# Patient Record
Sex: Female | Born: 1965
Health system: Southern US, Community
[De-identification: ages and names within clinical notes are randomized; demographics above are authoritative.]

## PROBLEM LIST (undated history)

## (undated) DIAGNOSIS — R600 Localized edema: Secondary | ICD-10-CM

## (undated) DIAGNOSIS — F329 Major depressive disorder, single episode, unspecified: Secondary | ICD-10-CM

## (undated) DIAGNOSIS — E7800 Pure hypercholesterolemia, unspecified: Secondary | ICD-10-CM

## (undated) DIAGNOSIS — E119 Type 2 diabetes mellitus without complications: Secondary | ICD-10-CM

## (undated) DIAGNOSIS — R0602 Shortness of breath: Secondary | ICD-10-CM

## (undated) DIAGNOSIS — G4733 Obstructive sleep apnea (adult) (pediatric): Secondary | ICD-10-CM

## (undated) DIAGNOSIS — E669 Obesity, unspecified: Secondary | ICD-10-CM

## (undated) DIAGNOSIS — F419 Anxiety disorder, unspecified: Secondary | ICD-10-CM

## (undated) DIAGNOSIS — M549 Dorsalgia, unspecified: Secondary | ICD-10-CM

## (undated) DIAGNOSIS — H538 Other visual disturbances: Secondary | ICD-10-CM

## (undated) DIAGNOSIS — K5900 Constipation, unspecified: Secondary | ICD-10-CM

## (undated) DIAGNOSIS — F32A Depression, unspecified: Secondary | ICD-10-CM

## (undated) DIAGNOSIS — I10 Essential (primary) hypertension: Secondary | ICD-10-CM

## (undated) HISTORY — DX: Depression, unspecified: F32.A

## (undated) HISTORY — DX: Type 2 diabetes mellitus without complications: E11.9

## (undated) HISTORY — DX: Other visual disturbances: H53.8

## (undated) HISTORY — DX: Obesity, unspecified: E66.9

## (undated) HISTORY — DX: Constipation, unspecified: K59.00

## (undated) HISTORY — DX: Shortness of breath: R06.02

## (undated) HISTORY — DX: Localized edema: R60.0

## (undated) HISTORY — DX: Essential (primary) hypertension: I10

## (undated) HISTORY — PX: BREAST CYST EXCISION: SHX579

## (undated) HISTORY — DX: Anxiety disorder, unspecified: F41.9

## (undated) HISTORY — DX: Dorsalgia, unspecified: M54.9

## (undated) HISTORY — DX: Pure hypercholesterolemia, unspecified: E78.00

## (undated) HISTORY — DX: Obstructive sleep apnea (adult) (pediatric): G47.33

---
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
## 2001-10-14 ENCOUNTER — Ambulatory Visit (HOSPITAL_COMMUNITY): Admission: RE | Admit: 2001-10-14 | Discharge: 2001-10-14 | Payer: Self-pay | Admitting: Gastroenterology

## 2003-10-27 ENCOUNTER — Ambulatory Visit (HOSPITAL_COMMUNITY): Admission: RE | Admit: 2003-10-27 | Discharge: 2003-10-27 | Payer: Self-pay | Admitting: Family Medicine

## 2004-05-15 ENCOUNTER — Emergency Department (HOSPITAL_COMMUNITY): Admission: EM | Admit: 2004-05-15 | Discharge: 2004-05-15 | Payer: Self-pay | Admitting: Family Medicine

## 2004-12-13 ENCOUNTER — Other Ambulatory Visit: Admission: RE | Admit: 2004-12-13 | Discharge: 2004-12-13 | Payer: Self-pay | Admitting: Family Medicine

## 2005-12-09 ENCOUNTER — Encounter: Admission: RE | Admit: 2005-12-09 | Discharge: 2005-12-09 | Payer: Self-pay | Admitting: Family Medicine

## 2006-05-21 ENCOUNTER — Encounter: Admission: RE | Admit: 2006-05-21 | Discharge: 2006-05-21 | Payer: Self-pay | Admitting: Family Medicine

## 2006-09-26 ENCOUNTER — Encounter: Admission: RE | Admit: 2006-09-26 | Discharge: 2006-09-26 | Payer: Self-pay | Admitting: Family Medicine

## 2006-11-26 ENCOUNTER — Ambulatory Visit (HOSPITAL_COMMUNITY): Admission: RE | Admit: 2006-11-26 | Discharge: 2006-11-26 | Payer: Self-pay | Admitting: Gastroenterology

## 2006-12-11 ENCOUNTER — Encounter: Admission: RE | Admit: 2006-12-11 | Discharge: 2006-12-11 | Payer: Self-pay | Admitting: Family Medicine

## 2007-02-26 ENCOUNTER — Encounter (INDEPENDENT_AMBULATORY_CARE_PROVIDER_SITE_OTHER): Payer: Self-pay | Admitting: Diagnostic Radiology

## 2007-02-26 ENCOUNTER — Encounter: Admission: RE | Admit: 2007-02-26 | Discharge: 2007-02-26 | Payer: Self-pay | Admitting: Surgery

## 2007-02-26 HISTORY — PX: BREAST BIOPSY: SHX20

## 2007-12-22 ENCOUNTER — Encounter: Admission: RE | Admit: 2007-12-22 | Discharge: 2007-12-22 | Payer: Self-pay | Admitting: Family Medicine

## 2008-03-16 ENCOUNTER — Emergency Department (HOSPITAL_COMMUNITY): Admission: EM | Admit: 2008-03-16 | Discharge: 2008-03-16 | Payer: Self-pay | Admitting: Family Medicine

## 2008-05-03 ENCOUNTER — Emergency Department (HOSPITAL_COMMUNITY): Admission: EM | Admit: 2008-05-03 | Discharge: 2008-05-03 | Payer: Self-pay | Admitting: Emergency Medicine

## 2008-08-31 ENCOUNTER — Emergency Department (HOSPITAL_COMMUNITY): Admission: EM | Admit: 2008-08-31 | Discharge: 2008-08-31 | Payer: Self-pay | Admitting: Family Medicine

## 2008-11-21 ENCOUNTER — Encounter: Admission: RE | Admit: 2008-11-21 | Discharge: 2008-11-21 | Payer: Self-pay | Admitting: Family Medicine

## 2008-12-23 ENCOUNTER — Encounter: Admission: RE | Admit: 2008-12-23 | Discharge: 2008-12-23 | Payer: Self-pay | Admitting: Family Medicine

## 2009-12-11 ENCOUNTER — Emergency Department (HOSPITAL_COMMUNITY): Admission: EM | Admit: 2009-12-11 | Discharge: 2009-12-11 | Payer: Self-pay | Admitting: Family Medicine

## 2010-01-16 ENCOUNTER — Encounter: Admission: RE | Admit: 2010-01-16 | Discharge: 2010-01-16 | Payer: Self-pay | Admitting: Family Medicine

## 2010-12-03 ENCOUNTER — Other Ambulatory Visit: Payer: Self-pay | Admitting: Dermatology

## 2011-01-18 ENCOUNTER — Other Ambulatory Visit: Payer: Self-pay | Admitting: *Deleted

## 2011-01-18 DIAGNOSIS — Z1231 Encounter for screening mammogram for malignant neoplasm of breast: Secondary | ICD-10-CM

## 2011-01-31 ENCOUNTER — Ambulatory Visit
Admission: RE | Admit: 2011-01-31 | Discharge: 2011-01-31 | Disposition: A | Discharge status: Home / Self Care | Payer: 59 | Source: Ambulatory Visit | Attending: *Deleted | Admitting: *Deleted

## 2011-01-31 DIAGNOSIS — Z1231 Encounter for screening mammogram for malignant neoplasm of breast: Secondary | ICD-10-CM

## 2011-02-01 ENCOUNTER — Other Ambulatory Visit: Payer: Self-pay | Admitting: Family Medicine

## 2011-02-01 DIAGNOSIS — R928 Other abnormal and inconclusive findings on diagnostic imaging of breast: Secondary | ICD-10-CM

## 2011-02-01 NOTE — Op Note (Signed)
NAMEDENICE, CARDON               ACCOUNT NO.:  [id number]   MEDICAL RECORD NO.:  [id number]          PATIENT TYPE:  AMB   LOCATION:  ENDO                         FACILITY:  [facility]   PHYSICIAN:  Anselmo Rod, M.D.  DATE OF BIRTH:  [birth year]   DATE OF PROCEDURE:  11/26/2006  DATE OF DISCHARGE:  11/26/2006                               OPERATIVE REPORT   PROCEDURE PERFORMED:  Screening colonoscopy.   ENDOSCOPIST:  Anselmo Rod, M.D.   INSTRUMENT USED:  Pentax video colonoscope.   INDICATIONS FOR PROCEDURE:  A 45 year old white female with a family  history of colon cancer in her mother undergoing a screening  colonoscopy.  The patient has a history of occasional rectal bleeding.  Rule out colonic polyps, masses, etc.   PREPROCEDURE PREPARATION:  Informed consent was procured from the  patient.  The patient fasted for 4 hours prior to the procedure and  prepped with 20 Osmoprep pills the night of and 12 Osmoprep pills the  morning of the procedure.  Risks and benefits of the procedure including  a 10% miss rate of cancer and polyps were discussed with the patient as  well.   PREPROCEDURE PHYSICAL:  VITAL SIGNS:  Patient had stable vital signs.  NECK:  Supple.  CHEST:  Clear to auscultation.  CARDIAC:  S1 and S2 regular.  ABDOMEN:  Soft with normal bowel sounds.   DESCRIPTION OF PROCEDURE:  The patient was placed in the left lateral  decubitus position and sedated with 100 mcg of fentanyl and 10 mg of  Versed given intravenously in slow incremental doses.  Once the patient  was adequately sedated and maintained on low-flow oxygen and continuous  cardiac monitoring, the Pentax video colonoscope was advanced from the  rectum to the cecum.  There was some residual stool in the colon.  Multiple washes were done.  No masses, polyps, erosions, ulcerations or  diverticula were seen.  Small internal hemorrhoids were appreciated on  retroflexion in the rectum.  The appendiceal  orifice and ileocecal valve  were visualized after multiple washes in the right colon.  The terminal  ileum appeared healthy without lesions.  Retroflexion revealed small  internal hemorrhoids.  The rest of exam was unremarkable.   IMPRESSION:  1. Some residual stool in the right colon.  No masses, polyps,      erosions, ulcerations or diverticula seen.  2. Normal terminal ileum.  3. Small internal hemorrhoids seen on retroflexion.   RECOMMENDATIONS:  1. Continue high fiber diet with liberal fluid intake.  2. Repeat prep with GoLYTELY in 5 years or at an earlier date if the      patient has any abnormal symptoms in the      interim.  3. If the patient has any further rectal bleeding, Anusol      suppositories will be tried for the hemorrhoids and further      recommendations made in follow-up.      Anselmo Rod, M.D.  Electronically Signed     JNM/MEDQ  D:  11/26/2006  T:  11/28/2006  Job:  [id number]  cc:   Chales Salmon. Abigail Miyamoto, M.D.

## 2011-02-01 NOTE — Discharge Summary (Signed)
[place]  Patient:    Rhonda Garza, Rhonda Garza Visit Number: [id number] MRN: [id number]          Service Type: END Location: ENDO Attending Physician:  Charna Elizabeth Dictated by:   Anselmo Rod, M.D. Admit Date:  10/14/2001   CC:         Chales Salmon. Abigail Miyamoto, M.D.   Discharge Summary  DATE OF BIRTH:  [birth year]  REFERRING PHYSICIAN:  Chales Salmon. Abigail Miyamoto, M.D.  PROCEDURE PERFORMED:  Screening colonoscopy.  ENDOSCOPIST:  Anselmo Rod, M.D.  INSTRUMENT USED:  Pediatric adjustable colonoscope.  INDICATIONS FOR PROCEDURE:  Rectal bleeding in a 45 year old white female with a family history of colon cancer in her mother.  Rule out colonic polyps, masses, hemorrhoids, etc.  PREPROCEDURE PREPARATION:  Informed consent was procured from the patient. The patient was fasted for eight hours prior to the procedure and prepped with a bottle of magnesium citrate and a gallon of NuLytely the night prior to the procedure.  PREPROCEDURE PHYSICAL:  The patient had stable vital signs.  Neck supple. Chest clear to auscultation.  S1, S2 regular.  Abdomen soft with normal bowel sounds.  DESCRIPTION OF PROCEDURE:  The patient was placed in the left lateral decubitus position and sedated with 60 mg of Demerol and 6 mg of Versed intravenously.  Once the patient was adequately sedated and maintained on low-flow oxygen and continuous cardiac monitoring, the Olympus video colonoscope was advanced from the rectum to the cecum without difficulty. Except for a small external hemorrhoid and small internal hemorrhoids, no other abnormalities were noted.  The procedure was complete up to the cecum. The ileocecal valve and appendiceal orifice were clearly visualized and photographed.  There was no evidence of diverticulosis.  IMPRESSION: 1. Small external hemorrhoid and small nonbleeding internal hemorrhoids. 2. Otherwise normal colonoscopy up to the  cecum.  RECOMMENDATIONS: 1. A high fiber diet has been recommended for the patient. 2. If the patients rectal bleeding continues, a surgical evaluation may be    considered. 3. Repeat colorectal cancer screening is recommended in the next five years    unless the patient were to develop any abnormal symptoms in the interim. 4. Outpatient follow-up is advised on a p.r.n. basis.Dictated by:   Dorita Sciara, M.D. Attending Physician:  Charna Elizabeth DD:  10/14/01 TD:  10/14/01 Job: [id number]

## 2011-02-06 ENCOUNTER — Ambulatory Visit
Admission: RE | Admit: 2011-02-06 | Discharge: 2011-02-06 | Disposition: A | Discharge status: Home / Self Care | Payer: 59 | Source: Ambulatory Visit | Attending: Family Medicine | Admitting: Family Medicine

## 2011-02-06 ENCOUNTER — Other Ambulatory Visit: Payer: 59

## 2011-02-06 DIAGNOSIS — R928 Other abnormal and inconclusive findings on diagnostic imaging of breast: Secondary | ICD-10-CM

## 2011-07-30 DIAGNOSIS — F411 Generalized anxiety disorder: Secondary | ICD-10-CM | POA: Insufficient documentation

## 2012-02-14 ENCOUNTER — Other Ambulatory Visit: Payer: Self-pay | Admitting: Family Medicine

## 2012-02-14 DIAGNOSIS — Z1231 Encounter for screening mammogram for malignant neoplasm of breast: Secondary | ICD-10-CM

## 2012-02-27 ENCOUNTER — Ambulatory Visit
Admission: RE | Admit: 2012-02-27 | Discharge: 2012-02-27 | Disposition: A | Discharge status: Home / Self Care | Payer: 59 | Source: Ambulatory Visit | Attending: Family Medicine | Admitting: Family Medicine

## 2012-02-27 DIAGNOSIS — Z1231 Encounter for screening mammogram for malignant neoplasm of breast: Secondary | ICD-10-CM

## 2012-03-03 ENCOUNTER — Ambulatory Visit: Payer: 59

## 2013-05-24 ENCOUNTER — Other Ambulatory Visit: Payer: Self-pay

## 2013-05-24 ENCOUNTER — Ambulatory Visit
Admission: RE | Admit: 2013-05-24 | Discharge: 2013-05-24 | Disposition: A | Discharge status: Home / Self Care | Payer: 59 | Source: Ambulatory Visit

## 2013-05-24 DIAGNOSIS — Z1231 Encounter for screening mammogram for malignant neoplasm of breast: Secondary | ICD-10-CM

## 2014-01-21 ENCOUNTER — Other Ambulatory Visit: Payer: Self-pay | Admitting: Dermatology

## 2014-03-08 ENCOUNTER — Other Ambulatory Visit: Payer: Self-pay | Admitting: Dermatology

## 2014-03-31 DIAGNOSIS — Z808 Family history of malignant neoplasm of other organs or systems: Secondary | ICD-10-CM | POA: Insufficient documentation

## 2014-05-30 ENCOUNTER — Other Ambulatory Visit: Payer: Self-pay

## 2014-05-30 DIAGNOSIS — Z1231 Encounter for screening mammogram for malignant neoplasm of breast: Secondary | ICD-10-CM

## 2014-06-23 ENCOUNTER — Ambulatory Visit
Admission: RE | Admit: 2014-06-23 | Discharge: 2014-06-23 | Disposition: A | Discharge status: Home / Self Care | Payer: 59 | Source: Ambulatory Visit

## 2014-06-23 DIAGNOSIS — Z1231 Encounter for screening mammogram for malignant neoplasm of breast: Secondary | ICD-10-CM

## 2015-08-07 ENCOUNTER — Other Ambulatory Visit: Payer: Self-pay

## 2015-08-07 DIAGNOSIS — Z1231 Encounter for screening mammogram for malignant neoplasm of breast: Secondary | ICD-10-CM

## 2015-08-08 ENCOUNTER — Ambulatory Visit
Admission: RE | Admit: 2015-08-08 | Discharge: 2015-08-08 | Disposition: A | Discharge status: Home / Self Care | Payer: 59 | Source: Ambulatory Visit

## 2015-08-08 DIAGNOSIS — Z1231 Encounter for screening mammogram for malignant neoplasm of breast: Secondary | ICD-10-CM

## 2015-11-15 DIAGNOSIS — G4733 Obstructive sleep apnea (adult) (pediatric): Secondary | ICD-10-CM

## 2015-11-15 HISTORY — DX: Obstructive sleep apnea (adult) (pediatric): G47.33

## 2015-12-01 MED FILL — SIMVASTATIN 40 MG TABLET: 40 | 30 days supply | Qty: 30 | Fill #0

## 2015-12-01 MED FILL — VALSARTAN-HCTZ 160-12.5 MG: 160-12.5 | 30 days supply | Qty: 30 | Fill #0

## 2015-12-01 MED FILL — ALPRAZolam 0.25 MG TABS: 0.25 | 30 days supply | Qty: 30 | Fill #0

## 2015-12-01 MED FILL — ESCITALOPRAM 10 MG TABLET: 10 | 90 days supply | Qty: 90 | Fill #0 | Status: TO

## 2016-01-16 DIAGNOSIS — I10 Essential (primary) hypertension: Secondary | ICD-10-CM | POA: Diagnosis not present

## 2016-01-16 DIAGNOSIS — F5081 Binge eating disorder: Secondary | ICD-10-CM | POA: Diagnosis not present

## 2016-01-16 DIAGNOSIS — Z0001 Encounter for general adult medical examination with abnormal findings: Secondary | ICD-10-CM | POA: Diagnosis not present

## 2016-01-16 MED FILL — VYVANSE 50 MG CAPSULE: 50 | 30 days supply | Qty: 30 | Fill #0

## 2016-01-16 MED FILL — SIMVASTATIN 40 MG TABLET: 40 | 60 days supply | Qty: 60 | Fill #0

## 2016-01-16 MED FILL — VALSARTAN-HCTZ 160-12.5 MG: 160-12.5 | 90 days supply | Qty: 90 | Fill #0

## 2016-01-17 DIAGNOSIS — I10 Essential (primary) hypertension: Secondary | ICD-10-CM | POA: Diagnosis not present

## 2016-01-17 DIAGNOSIS — Z0000 Encounter for general adult medical examination without abnormal findings: Secondary | ICD-10-CM | POA: Diagnosis not present

## 2016-01-17 DIAGNOSIS — E785 Hyperlipidemia, unspecified: Secondary | ICD-10-CM | POA: Diagnosis not present

## 2016-03-08 MED FILL — ESCITALOPRAM 10 MG TABLET: 10 | 90 days supply | Qty: 90 | Fill #0

## 2016-03-08 MED FILL — SIMVASTATIN 40 MG TABLET: 40 | 30 days supply | Qty: 30 | Fill #0

## 2016-03-08 MED FILL — ALPRAZolam 0.25 MG TABS: 0.25 | 30 days supply | Qty: 30 | Fill #0

## 2016-04-08 DIAGNOSIS — F411 Generalized anxiety disorder: Secondary | ICD-10-CM | POA: Diagnosis not present

## 2016-04-08 DIAGNOSIS — I10 Essential (primary) hypertension: Secondary | ICD-10-CM | POA: Diagnosis not present

## 2016-04-15 MED FILL — SIMVASTATIN 40 MG TABLET: 40 | 90 days supply | Qty: 90 | Fill #1

## 2016-04-15 MED FILL — VALSARTAN-HCTZ 160-12.5 MG: 160-12.5 | 90 days supply | Qty: 90 | Fill #1

## 2016-06-18 DIAGNOSIS — L821 Other seborrheic keratosis: Secondary | ICD-10-CM | POA: Diagnosis not present

## 2016-06-18 DIAGNOSIS — D224 Melanocytic nevi of scalp and neck: Secondary | ICD-10-CM | POA: Diagnosis not present

## 2016-06-18 DIAGNOSIS — B078 Other viral warts: Secondary | ICD-10-CM | POA: Diagnosis not present

## 2016-06-18 DIAGNOSIS — D2271 Melanocytic nevi of right lower limb, including hip: Secondary | ICD-10-CM | POA: Diagnosis not present

## 2016-06-18 DIAGNOSIS — D2261 Melanocytic nevi of right upper limb, including shoulder: Secondary | ICD-10-CM | POA: Diagnosis not present

## 2016-06-18 DIAGNOSIS — D2262 Melanocytic nevi of left upper limb, including shoulder: Secondary | ICD-10-CM | POA: Diagnosis not present

## 2016-06-18 DIAGNOSIS — D225 Melanocytic nevi of trunk: Secondary | ICD-10-CM | POA: Diagnosis not present

## 2016-06-18 DIAGNOSIS — D485 Neoplasm of uncertain behavior of skin: Secondary | ICD-10-CM | POA: Diagnosis not present

## 2016-06-18 DIAGNOSIS — D2272 Melanocytic nevi of left lower limb, including hip: Secondary | ICD-10-CM | POA: Diagnosis not present

## 2016-06-18 DIAGNOSIS — D2371 Other benign neoplasm of skin of right lower limb, including hip: Secondary | ICD-10-CM | POA: Diagnosis not present

## 2016-07-09 MED FILL — VALSARTAN-HCTZ 160-12.5 MG: 160-12.5 | 90 days supply | Qty: 90 | Fill #0

## 2016-07-09 MED FILL — ESCITALOPRAM 10 MG TABLET: 10 | 90 days supply | Qty: 90 | Fill #0

## 2016-07-09 MED FILL — SIMVASTATIN 40 MG TABLET: 40 | 60 days supply | Qty: 60 | Fill #2

## 2016-08-06 MED FILL — ALPRAZolam 0.25 MG TABS: 0.25 | 30 days supply | Qty: 30 | Fill #0

## 2016-09-06 DIAGNOSIS — I10 Essential (primary) hypertension: Secondary | ICD-10-CM | POA: Diagnosis not present

## 2016-09-06 DIAGNOSIS — Z6841 Body Mass Index (BMI) 40.0 and over, adult: Secondary | ICD-10-CM | POA: Diagnosis not present

## 2016-09-06 DIAGNOSIS — Z79899 Other long term (current) drug therapy: Secondary | ICD-10-CM | POA: Diagnosis not present

## 2016-09-06 DIAGNOSIS — E785 Hyperlipidemia, unspecified: Secondary | ICD-10-CM | POA: Diagnosis not present

## 2016-09-06 DIAGNOSIS — E119 Type 2 diabetes mellitus without complications: Secondary | ICD-10-CM | POA: Insufficient documentation

## 2016-09-06 DIAGNOSIS — F411 Generalized anxiety disorder: Secondary | ICD-10-CM | POA: Diagnosis not present

## 2016-09-06 MED FILL — SIMVASTATIN 40 MG TABLET: 40 | 90 days supply | Qty: 90 | Fill #0

## 2016-09-06 MED FILL — ALPRAZolam 0.25 MG TABS: 0.25 | 30 days supply | Qty: 30 | Fill #0

## 2016-09-13 MED FILL — VALSARTAN-HCTZ 160-25 MG TA: 160-25 | 90 days supply | Qty: 90 | Fill #0

## 2016-09-13 MED FILL — FLUTICASONE PROP 50 MCG SPR: 50 | 60 days supply | Qty: 16 | Fill #0

## 2016-09-20 MED FILL — metFORMIN HCL 500 MG TABS: 500 | 30 days supply | Qty: 60 | Fill #0

## 2016-09-26 DIAGNOSIS — J0110 Acute frontal sinusitis, unspecified: Secondary | ICD-10-CM | POA: Diagnosis not present

## 2016-09-26 DIAGNOSIS — I10 Essential (primary) hypertension: Secondary | ICD-10-CM | POA: Diagnosis not present

## 2016-09-26 DIAGNOSIS — Z79899 Other long term (current) drug therapy: Secondary | ICD-10-CM | POA: Diagnosis not present

## 2016-09-26 DIAGNOSIS — R0683 Snoring: Secondary | ICD-10-CM | POA: Diagnosis not present

## 2016-09-26 DIAGNOSIS — E1165 Type 2 diabetes mellitus with hyperglycemia: Secondary | ICD-10-CM | POA: Diagnosis not present

## 2016-09-26 DIAGNOSIS — E119 Type 2 diabetes mellitus without complications: Secondary | ICD-10-CM | POA: Diagnosis not present

## 2016-09-26 MED FILL — AMOX-CLAV 875-125 MG TABLET: 875-125 | 14 days supply | Qty: 28 | Fill #0

## 2016-09-27 ENCOUNTER — Other Ambulatory Visit: Payer: Self-pay | Admitting: Family Medicine

## 2016-09-27 DIAGNOSIS — Z1231 Encounter for screening mammogram for malignant neoplasm of breast: Secondary | ICD-10-CM

## 2016-10-11 MED FILL — metFORMIN HCL 500 MG TABS: 500 | 30 days supply | Qty: 120 | Fill #0

## 2016-10-22 ENCOUNTER — Ambulatory Visit
Admission: RE | Admit: 2016-10-22 | Discharge: 2016-10-22 | Disposition: A | Discharge status: Home / Self Care | Payer: 59 | Source: Ambulatory Visit | Attending: Family Medicine | Admitting: Family Medicine

## 2016-10-22 DIAGNOSIS — Z1231 Encounter for screening mammogram for malignant neoplasm of breast: Secondary | ICD-10-CM | POA: Diagnosis not present

## 2016-10-23 ENCOUNTER — Other Ambulatory Visit: Payer: Self-pay | Admitting: Family Medicine

## 2016-10-23 ENCOUNTER — Other Ambulatory Visit: Payer: Self-pay

## 2016-10-23 DIAGNOSIS — N63 Unspecified lump in unspecified breast: Secondary | ICD-10-CM

## 2016-10-23 DIAGNOSIS — R928 Other abnormal and inconclusive findings on diagnostic imaging of breast: Secondary | ICD-10-CM

## 2016-10-29 ENCOUNTER — Ambulatory Visit
Admission: RE | Admit: 2016-10-29 | Discharge: 2016-10-29 | Disposition: A | Discharge status: Home / Self Care | Payer: 59 | Source: Ambulatory Visit | Attending: Family Medicine | Admitting: Family Medicine

## 2016-10-29 DIAGNOSIS — R928 Other abnormal and inconclusive findings on diagnostic imaging of breast: Secondary | ICD-10-CM

## 2016-10-29 DIAGNOSIS — N6311 Unspecified lump in the right breast, upper outer quadrant: Secondary | ICD-10-CM | POA: Diagnosis not present

## 2016-11-04 ENCOUNTER — Encounter: Payer: Self-pay | Admitting: Neurology

## 2016-11-04 ENCOUNTER — Ambulatory Visit (INDEPENDENT_AMBULATORY_CARE_PROVIDER_SITE_OTHER): Payer: 59 | Admitting: Neurology

## 2016-11-04 VITALS — BP 124/76 | HR 78 | Resp 16 | Ht 64.0 in | Wt 272.0 lb

## 2016-11-04 DIAGNOSIS — I10 Essential (primary) hypertension: Secondary | ICD-10-CM | POA: Diagnosis not present

## 2016-11-04 DIAGNOSIS — E669 Obesity, unspecified: Secondary | ICD-10-CM

## 2016-11-04 DIAGNOSIS — E1100 Type 2 diabetes mellitus with hyperosmolarity without nonketotic hyperglycemic-hyperosmolar coma (NKHHC): Secondary | ICD-10-CM

## 2016-11-04 DIAGNOSIS — R0683 Snoring: Secondary | ICD-10-CM

## 2016-11-04 DIAGNOSIS — G4719 Other hypersomnia: Secondary | ICD-10-CM

## 2016-11-04 NOTE — Patient Instructions (Signed)

## 2016-11-04 NOTE — Progress Notes (Signed)
SLEEP MEDICINE CLINIC   Provider:  Larey Seat, M D  Referring Provider: Jeri Cos, MD Primary Care Physician:  Jeri Cos, MD in [facility]  Chief Complaint  Patient presents with  . Sleep Consult    Rm 10. Snores, witnessed apnea, wakes up feeling tired, morning headaches, daytime fatigue, takes naps.     HPI:  Rhonda Garza is a 51 y.o. female , seen here as a referral from Dr. Hector Shade for a sleep evaluation,  Chief complaint according to patient : "I am obese, and snore and feel always tired"   I had the pleasure of seeing Rhonda Garza is a new patient today. Rhonda Garza is concerned about having sleep apnea, she suspects that she may have sleep disordered breathing. BuSpar she has never been formally evaluated. But her comorbidities of morbid obesity, hypertension, diabetes mellitus, as well as anxiety and non-restorative sleep have made her think that she may have sleep apnea as well. She further endorsed restless legs, the feeling of being hot at night which may be perimenopausal, and blurred vision in the morning could all indicate or attributed to obstructive sleep apnea. Her diabetic condition was diagnosed in December  2017.  She has lost 13 pounds over the last 2 month after she begun taking metformin. She has bought a fit bit in October, and this begun her new health awareness.  Her husband has witnessed her to snore every night and every morning she feels tired.  Sleep habits are as follows: She works usually in front of a computer screen and on her smart form until bedtime. Bedtime is usually between 11 PM and midnight, she will go to the bedroom at that time and does not watch TV or use any back lit screens in the bedroom. He prefers up from sleep position but also sleeps on her side. She will choke or feel short of air when sleeping supine., She sleeps on multiple pillows, 3-4.  Her bedroom is dark, the usual bedroom temperature is around 68  Fahrenheit, and quiet. She shares a bed with her husband. She does not report interrupted sleep, she does not have the urge to urinate at night. She rises in the morning with an alarm at 6:30- 7.30 and wishes to sleep another hour.  Over the last month or years she has developed a dry mouth in the morning, and frequently headaches. The headaches lift around lunch time. She drinks caffeine in AM, Soda , and commutes one hour each way to work at [employer], and she drives feeling tired.  She has a window at her desk. She keeps a regular lunch break, but keeps at her desk.     Sleep medical history and family sleep history: mother had OSA, died in 01-08-2003.   Social history: married with 2 children, a son 46 and a daughter, 47. Former smoker, quit in her first pregnancy. Rare ETOH.  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  , "desk job ". Walking for exercise. 5 times a week.   Review of Systems: Out of a complete 14 system review, the patient complains of only the following symptoms, and all other reviewed systems are negative.  Epworth score 11 , Fatigue severity score 37  , depression score 2/15    Social History   Social History  . Marital status: Married    Spouse name: Rhonda Garza  . Number of children: 2  . Years of education: college   Occupational History  . Not on file.  Social History Main Topics  . Smoking status: Former Research scientist (life sciences)  . Smokeless tobacco: Never Used     Comment: Quit 1999  . Alcohol use Yes     Comment: Rare  . Drug use: No  . Sexual activity: Not on file   Other Topics Concern  . Not on file   Social History Narrative   Drinks caffeine, trying to stop currently     Family History  Problem Relation Age of Onset  . Cancer Mother   . Cancer Father     Past Medical History:  Diagnosis Date  . Anxiety   . Diabetes mellitus without complication ([place])   . High cholesterol   . Hypertension     Past Surgical History:  Procedure Laterality Date  . BREAST BIOPSY  Right 02/26/2007  . BREAST EXCISIONAL BIOPSY Right 1985  . CESAREAN SECTION      Current Outpatient Prescriptions  Medication Sig Dispense Refill  . ALPRAZolam (XANAX) 0.25 MG tablet Take by mouth.    . escitalopram (LEXAPRO) 10 MG tablet TAKE 1 TABLET BY MOUTH DAILY    . metFORMIN (GLUCOPHAGE) 500 MG tablet   2  . simvastatin (ZOCOR) 40 MG tablet TAKE 1 TABLET BY MOUTH AT BEDTIME.    . valsartan-hydrochlorothiazide (DIOVAN-HCT) 160-25 MG tablet Take by mouth.     No current facility-administered medications for this visit.     Allergies as of 11/04/2016  . (No Known Allergies)    Vitals: BP 124/76   Pulse 78   Resp 16   Ht 5\' 4"  (1.626 m)   Wt 272 lb (123.4 kg)   BMI 46.69 kg/m  Last Weight:  Wt Readings from Last 1 Encounters:  11/04/16 272 lb (123.4 kg)   [id number] mass index is 46.69 kg/m.     Last Height:   Ht Readings from Last 1 Encounters:  11/04/16 5\' 4"  (1.626 m)    Physical exam:  General: The patient is awake, alert and appears not in acute distress. The patient is well groomed. Head: Normocephalic, atraumatic. Neck is supple. Mallampati 5- ,  neck circumference:18.5. Nasal airflow patent , Retrognathia is not seen.  Cardiovascular:  Regular rate and rhythm , without  murmurs or carotid bruit, and without distended neck veins. Respiratory: Lungs are clear to auscultation. Skin:  Without evidence of edema, or rash Trunk: BMI is 47 . The patient's posture is erect   Neurologic exam : The patient is awake and alert, oriented to place and time.   Attention span & concentration ability appears limited, distractible Speech is fluent,  without dysarthria, dysphonia or aphasia.  Mood and affect are appropriate.  Cranial nerves: Pupils are equal and briskly reactive to light.  Funduscopic exam without  evidence of pallor or edema. Extraocular movements  in vertical and horizontal planes intact and without nystagmus. Visual fields by finger perimetry are  intact. Hearing to finger rub intact. Facial sensation intact to fine touch. Facial motor strength is symmetric and tongue moved  Midline. Uvula cannot be seen -  Shoulder shrug was symmetrical.   Motor exam:   Normal tone, muscle bulk and symmetric strength in all extremities. Sensory:  Fine touch, pinprick and vibration were tested in all extremities. Proprioception tested in the upper extremities was normal. Coordination: Rapid alternating movements in the fingers/hands was normal. Finger-to-nose maneuver  normal without evidence of ataxia, dysmetria or tremor.  Gait and station: Patient walks without assistive device and is able unassisted to climb up to the exam  table. Strength within normal limits.  Stance is stable and normal.   Deep tendon reflexes: in the  upper and lower extremities are symmetric and intact. Babinski maneuver response is downgoing.  The patient was advised of the nature of the diagnosed sleep disorder , the treatment options and risks for general a health and wellness arising from not treating the condition.  I spent more than 45  minutes of face to face time with the patient. Greater than 50% of time was spent in counseling and coordination of care. We have discussed the diagnosis and differential and I answered the patient's questions.     Assessment:  After physical and neurologic examination, review of laboratory studies,  Personal review of imaging studies, reports of other /same  Imaging studies ,  Results of polysomnography/ neurophysiology testing and pre-existing records as far as provided in visit., my assessment is   1)   Mrs. Blankley has multiple risk factors for obstructive sleep apnea, mainly the body mass index, her neck circumference and her high grade narrowed upper airway. Her husband has witnessed her to snore and she has woken from sleep feeling choked short of breath especially when in supine position. I have no doubt that we will diagnose her with  obstructive sleep apnea. Her fit bit also has recorded her as being rather restless tossing and turning and her sleep quality is perceived as before. She has more daytime sleepiness, and fatigue. In addition she was just diagnosed with diabetes mellitus, started on metformin and has begun losing weight. She is motivated to continue her weight loss quest and hopefully reverse the diabetic condition. Treatment of obstructive sleep apnea can be essential in achieving this goal. It is a the deepest sleep before midnight that allows her to cleave more insulin and to lower her overnight blood sugars.  Plan:  Treatment plan and additional workup :  SPLIT at [place], MD  RV with me   Larey Seat MD  11/04/2016   CC: Jeri Cos, Md [place], [place] [id number]

## 2016-11-13 MED FILL — metFORMIN HCL 500 MG TABS: 500 | 30 days supply | Qty: 120 | Fill #1

## 2016-11-18 ENCOUNTER — Ambulatory Visit (INDEPENDENT_AMBULATORY_CARE_PROVIDER_SITE_OTHER): Payer: 59 | Admitting: Neurology

## 2016-11-18 DIAGNOSIS — E1100 Type 2 diabetes mellitus with hyperosmolarity without nonketotic hyperglycemic-hyperosmolar coma (NKHHC): Secondary | ICD-10-CM

## 2016-11-18 DIAGNOSIS — G4710 Hypersomnia, unspecified: Secondary | ICD-10-CM

## 2016-11-18 DIAGNOSIS — G4719 Other hypersomnia: Secondary | ICD-10-CM

## 2016-11-18 DIAGNOSIS — E669 Obesity, unspecified: Secondary | ICD-10-CM

## 2016-11-18 DIAGNOSIS — R0683 Snoring: Secondary | ICD-10-CM

## 2016-11-18 DIAGNOSIS — I10 Essential (primary) hypertension: Secondary | ICD-10-CM

## 2016-11-21 ENCOUNTER — Telehealth: Payer: Self-pay

## 2016-11-21 NOTE — Procedures (Signed)
PATIENT'S NAME:  Rhonda Garza, Rhonda Garza DOB:      02/25/1966      MR#:    [id number]     DATE OF RECORDING: 11/18/2016 REFERRING M.D.:  Jeri Cos, MD in [facility] Performed:   Baseline Polysomnogram HISTORY:  Mrs. Jungwirth is concerned about sleep apnea, she suspects that she may have sleep disordered breathing. Important comorbidities are super/ morbid obesity ( BMI of 46.3) , hypertension, diabetes mellitus, anxiety disorder and  restless legs, the feeling of being hot at night, anxiety and diaphoresis, and sometimes headaches are present in the morning, These all could indicate or attribute to obstructive sleep apnea.  Her diabetic condition was diagnosed in December 2017.  She has lost 13 pounds over the last 2 month after she begun taking metformin. She has bought a fit bit in October, and begun gaining more health awareness. Her husband has witnessed her to snore every night and every morning she feels tired.   The patient endorsed the Epworth Sleepiness Scale at 11/24 points.   The patient's weight 272 pounds with a height of 64 (inches), resulting in a BMI of 46.3 kg/m2. The patient's neck circumference measured 18.5 inches.  CURRENT MEDICATIONS: Alprazolam, Escitalopram, Metformin, Simvastatin and Valsartan-Hydrochlorothiazide   PROCEDURE:  This is a multichannel digital polysomnogram utilizing the Somnostar 11.2 system.  Electrodes and sensors were applied and monitored per AASM Specifications.   EEG, EOG, Chin and Limb EMG, were sampled at 200 Hz.  ECG, Snore and Nasal Pressure, Thermal Airflow, Respiratory Effort, CPAP Flow and Pressure, Oximetry was sampled at 50 Hz. Digital video and audio were recorded.      BASELINE STUDY Lights Out was at 22:40 and Lights On at 05:00.  Total recording time (TRT) was 380.5 minutes, with a total sleep time (TST) of 315 minutes.   The patient's sleep latency was 47.5 minutes.  REM latency was 231 minutes.  The sleep efficiency was 82.8 %.      SLEEP ARCHITECTURE: WASO (Wake after sleep onset) was 18 minutes.  There were 14.5 minutes in Stage N1, 265 minutes Stage N2, 0 minutes Stage N3 and 35.5 minutes in Stage REM.  The percentage of Stage N1 was 4.6%, Stage N2 was 84.1%, Stage N3 was 0% and Stage R (REM sleep) was 11.3%.    RESPIRATORY ANALYSIS:  There were a total of 22 respiratory events:  0 obstructive apneas, 0 central apneas and 0 mixed apneas with a total of 0 apneas and an apnea index (AI) of 0 /hour. There were 22 hypopneas with a hypopnea index of 4.2 /hour. The patient also had 0 respiratory event related arousals (RERAs).    The total APNEA/HYPOPNEA INDEX (AHI) was 4.2/hour and the total RESPIRATORY DISTURBANCE INDEX was 4.2 /hour.  7 events occurred in REM sleep and 30 events in NREM. The REM AHI was 11.8 /hour, versus a non-REM AHI of 3.2. The patient spent 35 minutes of total sleep time in the supine position and 280 minutes in non-supine. The supine AHI was 13.7 versus a non-supine AHI of 3.0.  OXYGEN SATURATION & C02:  The Wake baseline 02 saturation was 96%, with the lowest being 84%. Time spent below 89% saturation equaled 7 minutes.  PERIODIC LIMB MOVEMENTS:  The patient had a total of 0 Periodic Limb Movements.   The arousals were noted as: 22 were spontaneous, 0 were associated with PLMs, and 22 were associated with respiratory events. Audio and video analysis did not show any abnormal or unusual  movements, behaviors, phonations or vocalizations.  The patient did not take bathroom breaks. Snoring was noted and worse in supine. EKG was in keeping with normal sinus rhythm (NSR).   IMPRESSION:  1. Very mild Obstructive Sleep Apnea(OSA) with REM sleep and positional dependence. 2. Primary Snoring with supine sleep accentuation. 3.  No hypoxemia of clinical significance, no explanation for nocturnal hot flushes or headache .   RECOMMENDATIONS:  1. Positional therapy is advised. Avoiding supine sleep by using a  tennis ball.  2. Avoid sedative-hypnotics which may worsen sleep apnea, alcohol and tobacco (as applicable). 3. Advise to lose weight, diet and exercise if not contraindicated (BMI 46.3 ). 4. A dental device may be indicated if primary snoring is of clinical concern. 5. No neurological follow up needed. A follow up appointment will be scheduled with the Sleep Clinic at [facility] to discuss results and make referral arrangements as suggested above.  The referring provider will be notified of the results.      I certify that I have reviewed the entire raw data recording prior to the issuance of this report in accordance with the Standards of Accreditation of the American Academy of Sleep Medicine (AASM)      Larey Seat, MD  11-21-2016 Diplomat, American Board of Psychiatry and Neurology  Diplomat, American Board of [place] Director, [place] [facility]

## 2016-11-21 NOTE — Telephone Encounter (Signed)
-----   Message from Larey Seat, MD sent at 11/21/2016 12:14 PM EST ----- IMPRESSION:  1. Very mild Obstructive Sleep Apnea(OSA) with REM sleep and positional dependence. 2. Primary Snoring with supine sleep accentuation. 3.  No hypoxemia of clinical significance, no explanation for nocturnal hot flushes or headache .   RECOMMENDATIONS:  1. Positional therapy is advised. Avoiding supine sleep by using a tennis ball.  2. Avoid sedative-hypnotics which may worsen sleep apnea, alcohol and tobacco (as applicable). 3. Advise to lose weight, diet and exercise if not contraindicated (BMI 46.3). We can assist in referring to a bariatric medicine clinic.  4. A dental device may be indicated if primary snoring is of clinical concern. 5. No neurological follow up needed. A follow up appointment will be scheduled with the Sleep Clinic at [facility] to discuss results and make referral arrangements as suggested above.  The referring provider will be notified of the results.      I certify that I have reviewed the entire raw data recording prior to the issuance of this report in accordance with the Standards of Accreditation of the American Academy of Sleep Medicine (AASM)      Larey Seat, MD  11-21-2016 Diplomat, American Board of Psychiatry and Neurology  Diplomat, American Board of [place] Director, [place] [facility]

## 2016-11-21 NOTE — Telephone Encounter (Signed)
I called pt to discuss her sleep study results. No answer, left a message asking her to call me back. 

## 2016-11-21 NOTE — Telephone Encounter (Signed)
I called pt. I advised her of her sleep study results. Pt does not want the referrals suggested at this time and also declined a follow up appt. Pt verbalized understanding of results. Pt had no questions at this time but was encouraged to call back if questions arise.

## 2016-12-02 MED FILL — ESCITALOPRAM 10 MG TABLET: 10 | 90 days supply | Qty: 90 | Fill #0

## 2016-12-02 MED FILL — SIMVASTATIN 40 MG TABLET: 40 | 90 days supply | Qty: 90 | Fill #1

## 2016-12-04 DIAGNOSIS — E119 Type 2 diabetes mellitus without complications: Secondary | ICD-10-CM | POA: Diagnosis not present

## 2016-12-05 DIAGNOSIS — E785 Hyperlipidemia, unspecified: Secondary | ICD-10-CM | POA: Diagnosis not present

## 2016-12-05 DIAGNOSIS — R0683 Snoring: Secondary | ICD-10-CM | POA: Diagnosis not present

## 2016-12-05 DIAGNOSIS — I10 Essential (primary) hypertension: Secondary | ICD-10-CM | POA: Diagnosis not present

## 2016-12-05 DIAGNOSIS — E119 Type 2 diabetes mellitus without complications: Secondary | ICD-10-CM | POA: Diagnosis not present

## 2016-12-05 MED FILL — metFORMIN HCL 1000 MG TABS: 1000 | 90 days supply | Qty: 180 | Fill #0

## 2016-12-05 MED FILL — VICTOZA 2-PAK 18 MG/3 ML PE: 18 | 30 days supply | Qty: 9 | Fill #0

## 2016-12-11 ENCOUNTER — Other Ambulatory Visit: Payer: Self-pay | Admitting: *Deleted

## 2016-12-11 ENCOUNTER — Encounter: Payer: Self-pay | Admitting: *Deleted

## 2016-12-11 VITALS — Ht 64.0 in | Wt 268.0 lb

## 2016-12-11 DIAGNOSIS — E785 Hyperlipidemia, unspecified: Secondary | ICD-10-CM | POA: Insufficient documentation

## 2016-12-11 DIAGNOSIS — E119 Type 2 diabetes mellitus without complications: Secondary | ICD-10-CM

## 2016-12-11 DIAGNOSIS — E669 Obesity, unspecified: Secondary | ICD-10-CM | POA: Insufficient documentation

## 2016-12-11 DIAGNOSIS — I10 Essential (primary) hypertension: Secondary | ICD-10-CM

## 2016-12-11 LAB — POCT CBG (FASTING - GLUCOSE)-MANUAL ENTRY: GLUCOSE FASTING, POC: 121 mg/dL — AB (ref 70–99)

## 2016-12-11 NOTE — Patient Outreach (Signed)
[place] [facility]) Care Management   12/11/2016  Rhonda Garza 1966/09/16 [id number]  Rhonda Garza is an 51 y.o. female [place] Management office to enroll in the Link To Wellness program for self management assistance with Type II DM, HTN, hyperlipidemia and morbid obesity.  Subjective: Rhonda Garza says she was referred to the Link To Wellness program by coworkers after she was diagnosed with type II DM in the last year. She reports her most recent Hgb A1C as 7.1%, improved from 8.1%. She says she has not attended formal diabetes education classes and has not been checking her blood sugar because she knew she would receive a glucometer at no cost through the program.  She says she also has high blood pressure and cholesterol issues and that as far she knows the medications she is taking is controlling those conditions. She says she has a home BP monitor but does not self monitor because the cuff no longer fits her upper arm. She says she always attend the on site health screenings at her work place to have her blood pressure and blood sugar checked.  She says she would also like to lose weight as she has gained a  significant amount of weight in the last few years but has been overweight most of her adult life. Rhonda Garza says she has lost 16 lbs since December when she was told she had diabetes and says she started Victoza three  days ago and hopes this will also help her with ongoing weight loss.   Objective:   Review of Systems  Constitutional: Negative.     Physical Exam  Constitutional: She is oriented to person, place, and time. She appears well-developed and well-nourished.  Respiratory: Effort normal.  Neurological: She is alert and oriented to person, place, and time.  Skin: Skin is warm and dry.  Psychiatric: She has a normal mood and affect. Her behavior is normal. Judgment and thought content normal.   Weight= 268.0 lbs (BMI= 49.0) Random POC  CBG= 121  Encounter Medications:   Outpatient Encounter Prescriptions as of 12/11/2016  Medication Sig Note  . ALPRAZolam (XANAX) 0.25 MG tablet Take by mouth.   . escitalopram (LEXAPRO) 10 MG tablet TAKE 1 TABLET BY MOUTH DAILY   . levonorgestrel (MIRENA, 52 MG,) 20 MCG/24HR IUD 1 each by Intrauterine route once.   . liraglutide (VICTOZA) 18 MG/3ML SOPN Inject 0.6 mg into the skin. 12/11/2016: Injects in evening 8 pm  . metFORMIN (GLUCOPHAGE) 500 MG tablet  12/11/2016: Two tablets twice daily  . simvastatin (ZOCOR) 40 MG tablet TAKE 1 TABLET BY MOUTH AT BEDTIME.   . valsartan-hydrochlorothiazide (DIOVAN-HCT) 160-25 MG tablet Take by mouth.    No facility-administered encounter medications on file as of 12/11/2016.     Functional Status:   In your present state of health, do you have any difficulty performing the following activities: 12/11/2016  Hearing? N  Vision? N  Difficulty concentrating or making decisions? N  Walking or climbing stairs? N  Dressing or bathing? N  Doing errands, shopping? N  Preparing Food and eating ? N  Using the Toilet? N  In the past six months, have you accidently leaked urine? N  Do you have problems with loss of bowel control? N  Managing your Medications? N  Managing your Finances? N  Housekeeping or managing your Housekeeping? N  Some recent data might be hidden    Fall/Depression Screening:    PHQ 2/9 Scores 12/11/2016  PHQ - 2 Score 1    Assessment:  Rhonda Garza employee enrolling in the Link To Wellness program for self management assistance with chronic disease states of Type II DM, HTN, hyperlipidemia and morbid obesity.    Plan:  [facility] CM Care Plan Problem One     Most Recent Value  Care Plan Problem One  [place] employee enrolling in Link To wellness program for self management assistance with Type II DM, HTN , hyperlipidemia  and obesity.Current Hgb A1C  Of 7.1% not meeting target of <7.0 , HTN meeting treatment targets, lipid profile  shoes slightly elevated triglycerides, and current body mass index= 49.0  Role Documenting the Problem One  Care Management [place] for Problem One  Active  THN Long Term Goal (31-90 days)  Will meet treatment targets for Hgb A1C of<7.0%, self monitored CBG's will meet target at least 75% of the time, the average of weekly self monitored BP will meet targets of <140/<90  and lipid profile will be normal at next assessment  [facility] Long Term Goal Start Date  12/11/16  Interventions for Problem One Long Term Goal  Discussed Link to Wellness program goals, requirements and benefits, reviewed member's rights and responsibilities ,provided diabetes information packet with explanation of contents, ensured member agreed and signed consent to participate and authorization to release and receive health information, consent, participation agreement and consent to enroll in program, assessed member's current knowledge of diabetes, faxed [facility] for enrollment in to the required type II DM core classes, using the [place] representation, discussed the 8 core pathophysiologic deficits in Type II diabetes. Discussed physiology of diabetes as a chronic progressive disease with the initial problem of insulin resistance in the muscle, liver and fat cells and then increased loss of beta cell function over time resulting in decreased insulin production, discussed role of obesity, especially abdominal (visceral) obesity, on insulin resistance, reviewed patient's medications and assessed medication adherence, discussed DM medications of Metformin and Victoza including the mechanism of action, common side effects, dosages and dosing schedule, suggested she taper Metformin and take with meals to decrease the side effect of diarrhea or speak with her provider about changing to the extended release form, reinforced importance of taking all medications as prescribed,  discussed the need for the use of a combination of DM medications to correct the pathophysiologic core deficits and to  prevent or slow beta cell failure, discussed role of statins, and blood pressure medicines in the treatment of diabetes, provided education on the three primary macronutrients (CHO, protein, fat) and their effect on glucose levels,  provided education on carb counting using the plate method and the importance of regularly scheduled meals/snacks and meal planning, discussed [facility]  including the benefit coverage and provided written information and encouraged Ronette to attend the information session,discussed effects of physical activity on glucose levels and long-term glucose control by improving insulin sensitivity and assisting with weight management and cardiovascular health, issued Accu-Chek glucometer and demonstrated its use, checked Kelly's CBG and discussed results, discussed blood glucose monitoring and interpretation, discussed recommended target ranges for pre-meal and post-meal, provided blood sugar log sheets with targets for pre and post meal, and suggested she check her blood sugar in pairs, before and after a meal every other day and record results on the blood sugar log provided, discussed recommendations for day to day and long-term diabetes self-care, reviewed recommended daily foot checks, and  yearly cholesterol, urine, and eye testing,and recommendations for medical, dental, and emotional self-care, assigned Emmi education modules related to self monitoring blood pressure and blood sugar,  encouraged patient to view by completion date and to incorporate information gained through the modules to assist with Type II DM and HTN self -management, provided hand out on strategies to lower triglycerides since her triglyceride level on 09/06/16 was slightly elevated at 165, discussed BP treatment goals and issued purchase voucher for home BP monitor for $5  at the [facility] pharmacy and suggested she check BP once weekly, keep log  and take her readings to her provider visits,  reviewed upcoming appointments with patient's primary care MD, reinforced the importance of keeping the appointment,  encouraged patient to write questions/concerns in advance to discuss with health team member,  will arrange for Link To Wellness follow up in July after she sees her provider     This RNCM will fax today's note to [facility] primary care provider and request prescription for testing supplies be faxed to [place] pharmacy. Will meet with [name] every 3 months to assist with chronic disease management and assess progress towards mutually set goals.   Barrington Ellison RN,CCM,CDE [place] Management Coordinator Link To Wellness Office Phone [phone] Office Fax [phone]

## 2016-12-24 MED FILL — ALPRAZolam 0.25 MG TABS: 0.25 | 30 days supply | Qty: 30 | Fill #1

## 2016-12-24 MED FILL — FREESTYLE LITE TEST STRIP: 50 days supply | Qty: 50 | Fill #0

## 2016-12-24 MED FILL — VALSARTAN-HCTZ 160-25 MG TA: 160-25 | 90 days supply | Qty: 90 | Fill #1

## 2016-12-24 MED FILL — FREESTYLE LANCETS: 90 days supply | Qty: 100 | Fill #0

## 2017-01-02 MED FILL — VICTOZA 18 MG/3 ML INJECT P: 18 | 60 days supply | Qty: 18 | Fill #1

## 2017-01-03 MED FILL — PENTIPS 31G X 5 MM MISC: 31G X 5 MM | 90 days supply | Qty: 100 | Fill #0

## 2017-01-23 MED FILL — ALPRAZolam 0.25 MG TABS: 0.25 | 30 days supply | Qty: 30 | Fill #2

## 2017-02-28 MED FILL — SIMVASTATIN 40 MG TABLET: 40 | 90 days supply | Qty: 90 | Fill #0

## 2017-02-28 MED FILL — ALPRAZolam 0.25 MG TABS: 0.25 | 30 days supply | Qty: 30 | Fill #3

## 2017-02-28 MED FILL — metFORMIN HCL 1000 MG TABS: 1000 | 90 days supply | Qty: 180 | Fill #1

## 2017-02-28 MED FILL — ESCITALOPRAM 10 MG TABLET: 10 | 90 days supply | Qty: 90 | Fill #1

## 2017-03-10 MED FILL — VICTOZA 18 MG/3 ML INJECT P: 18 | 30 days supply | Qty: 9 | Fill #0

## 2017-03-11 MED FILL — VALSARTAN-HCTZ 160-25 MG TA: 160-25 | 30 days supply | Qty: 30 | Fill #0

## 2017-03-18 DIAGNOSIS — E119 Type 2 diabetes mellitus without complications: Secondary | ICD-10-CM | POA: Diagnosis not present

## 2017-03-21 DIAGNOSIS — E785 Hyperlipidemia, unspecified: Secondary | ICD-10-CM | POA: Diagnosis not present

## 2017-03-21 DIAGNOSIS — Z79899 Other long term (current) drug therapy: Secondary | ICD-10-CM | POA: Diagnosis not present

## 2017-03-21 DIAGNOSIS — Z6841 Body Mass Index (BMI) 40.0 and over, adult: Secondary | ICD-10-CM | POA: Diagnosis not present

## 2017-03-21 DIAGNOSIS — E119 Type 2 diabetes mellitus without complications: Secondary | ICD-10-CM | POA: Diagnosis not present

## 2017-03-21 DIAGNOSIS — Z23 Encounter for immunization: Secondary | ICD-10-CM | POA: Diagnosis not present

## 2017-03-21 DIAGNOSIS — I10 Essential (primary) hypertension: Secondary | ICD-10-CM | POA: Diagnosis not present

## 2017-03-25 ENCOUNTER — Ambulatory Visit: Payer: Self-pay | Admitting: *Deleted

## 2017-04-01 DIAGNOSIS — D225 Melanocytic nevi of trunk: Secondary | ICD-10-CM | POA: Diagnosis not present

## 2017-04-01 DIAGNOSIS — L821 Other seborrheic keratosis: Secondary | ICD-10-CM | POA: Diagnosis not present

## 2017-04-01 DIAGNOSIS — D2272 Melanocytic nevi of left lower limb, including hip: Secondary | ICD-10-CM | POA: Diagnosis not present

## 2017-04-01 DIAGNOSIS — D224 Melanocytic nevi of scalp and neck: Secondary | ICD-10-CM | POA: Diagnosis not present

## 2017-04-01 DIAGNOSIS — D2371 Other benign neoplasm of skin of right lower limb, including hip: Secondary | ICD-10-CM | POA: Diagnosis not present

## 2017-04-01 DIAGNOSIS — D1801 Hemangioma of skin and subcutaneous tissue: Secondary | ICD-10-CM | POA: Diagnosis not present

## 2017-04-16 MED FILL — VICTOZA 18 MG/3 ML INJECT P: 18 | 90 days supply | Qty: 27 | Fill #0

## 2017-04-16 MED FILL — VALSARTAN-HCTZ 160-25 MG TA: 160-25 | 30 days supply | Qty: 30 | Fill #0

## 2017-04-22 MED FILL — ALPRAZolam 0.25 MG TABS: 0.25 | 30 days supply | Qty: 30 | Fill #0

## 2017-05-14 ENCOUNTER — Ambulatory Visit: Payer: Self-pay | Admitting: *Deleted

## 2017-05-14 ENCOUNTER — Encounter: Payer: Self-pay | Admitting: *Deleted

## 2017-05-14 ENCOUNTER — Other Ambulatory Visit: Payer: Self-pay | Admitting: *Deleted

## 2017-05-14 NOTE — Patient Outreach (Signed)
[place] [facility]) Care Management   05/14/2017  Rhonda Garza 27-Nov-1965 [id number]  Rhonda Garza is an 51 y.o. female [place] Management office for routine Link To Wellness follow up for self management assistance with Type II DM, HTN, hyperlipidemia and morbid obesity.  Subjective: Rhonda Garza says she has not attended the Type II DM Core classes yet but plans to attend them in the future.  She reports she saw her primary care provider on 03/21/17 and was very pleased that her Hgb A1C, previously 7.1%.  Rhonda Garza agrees to enroll and participate in the [employer] for ongoing self management assistance with her diabetes.   Objective:   Review of Systems  Constitutional: Negative.     Physical Exam  Constitutional: She is oriented to person, place, and time. She appears well-developed and well-nourished.  Respiratory: Effort normal.  Neurological: She is alert and oriented to person, place, and time.  Skin: Skin is warm and dry.  Psychiatric: She has a normal mood and affect. Her behavior is normal. Judgment and thought content normal.   Today's Vitals   05/14/17 1600  BP: 102/78  Weight: 261 lb 3.2 oz (118.5 kg)  Height: 1.626 m (5\' 4" )    Encounter Medications:   Outpatient Encounter Prescriptions as of 05/14/2017  Medication Sig Note  . ALPRAZolam (XANAX) 0.25 MG tablet Take by mouth. 05/14/2017: Tales prn  . cetirizine (ZYRTEC) 10 MG tablet Take 10 mg by mouth as needed for allergies.   . cholecalciferol (VITAMIN D) 1000 units tablet Take 2,000 Units by mouth daily.   Marland Kitchen escitalopram (LEXAPRO) 10 MG tablet TAKE 1 TABLET BY MOUTH DAILY   . fluticasone (FLONASE) 50 MCG/ACT nasal spray Place into both nostrils as needed for allergies or rhinitis.   Marland Kitchen levonorgestrel (MIRENA, 52 MG,) 20 MCG/24HR IUD 1 each by Intrauterine route once.   . liraglutide (VICTOZA) 18 MG/3ML SOPN Inject 0.6 mg into the skin. 05/14/2017:  Current dose is 1.8 mg  . metFORMIN (GLUCOPHAGE) 500 MG tablet  05/14/2017: Takes 1000 mg tablet twice daily  . simvastatin (ZOCOR) 40 MG tablet TAKE 1 TABLET BY MOUTH AT BEDTIME.   . valsartan-hydrochlorothiazide (DIOVAN-HCT) 160-25 MG tablet Take by mouth.    No facility-administered encounter medications on file as of 05/14/2017.     Functional Status:   In your present state of health, do you have any difficulty performing the following activities: 05/14/2017 12/11/2016  Hearing? N N  Vision? N N  Difficulty concentrating or making decisions? N N  Walking or climbing stairs? N N  Dressing or bathing? N N  Doing errands, shopping? N N  Preparing Food and eating ? N N  Using the Toilet? N N  In the past six months, have you accidently leaked urine? N N  Do you have problems with loss of bowel control? N N  Managing your Medications? N N  Managing your Finances? N N  Housekeeping or managing your Housekeeping? N N  Some recent data might be hidden    Fall/Depression Screening:    PHQ 2/9 Scores 12/11/2016  PHQ - 2 Score 1    Assessment:  Rhonda Garza employee transitioning from the Link To Wellness program to the [facility] program for ongoing  self management assistance with chronic disease states of Type II DM, HTN, hyperlipidemia and morbid obesity.    Plan:  [facility] CM Care Plan Problem One     Most Recent Value  Care Plan Problem One Rhonda Garza employee with Type II DM, HTN , hyperlipidemia  and obesity. Good self management of DM, HTN and lipids as evidenced by most recent Hgb A1C = 6.2% previously 7.1% and now meeting target of <7.0 , HTN meeting treatment targets, lipid profile done 03/18/17 shows all elements meeting treatment targets, weight loss as evidenced by  current body mass index= 44.81 previously 45.98  Role Documenting the Problem One  Care Management [place] for Problem One  Active  [facility]N Long Term Goal (31-90 days) Ongoing good control  of chronic disease states as evidenced by meeting  treatment targets for each disease state:  Hgb A1C of <7.0%, self monitored CBG's will meet target at least 75% of the time, the average of weekly self monitored BP will meet targets of <140/<90  lipid profile will be normal at next assessment and ongoing evidence of weight loss or no weight gain at each assessment. Rhonda Garza will remain an active participant in the [facility] platform  [place] Term Goal Start Date  05/14/17  Interventions for Problem One Long Term Goal Reviewed labs results and treatment targets from [facility] visit with her primary care provider on 7/6 and congratulated her on her weight loss, improved Hgb A1C and improved lipid panel, provided her with a handout on strategies to assist with ongoing good management of lipids,reviewed blood glucose readings and reviewed target ranges for pre-meal and post-meal, reviewed the [employer], enrolled her in the program and provided her with a scale ( she already has the glucometer and she wears a [employer]. advised Jode that once she is onboarded into the [employer] ongoing self management assistance will be provided through the [employer].      This RNCM will fax today's note to [facility] primary care provider.  Barrington Ellison RN,CCM,CDE [place] Management Coordinator Link To Wellness Office Phone [phone] Office Fax [phone]

## 2017-05-15 MED FILL — VALSARTAN-HCTZ 160-25 MG TA: 160-25 | 90 days supply | Qty: 90 | Fill #1

## 2017-05-28 MED FILL — metFORMIN HCL 1000 MG TABS: 1000 | 90 days supply | Qty: 180 | Fill #0

## 2017-05-28 MED FILL — SIMVASTATIN 40 MG TABLET: 40 | 90 days supply | Qty: 90 | Fill #1

## 2017-05-28 MED FILL — ESCITALOPRAM 10 MG TABLET: 10 | 90 days supply | Qty: 90 | Fill #0

## 2017-07-09 DIAGNOSIS — E119 Type 2 diabetes mellitus without complications: Secondary | ICD-10-CM | POA: Diagnosis not present

## 2017-07-11 DIAGNOSIS — E119 Type 2 diabetes mellitus without complications: Secondary | ICD-10-CM | POA: Diagnosis not present

## 2017-07-11 DIAGNOSIS — Z79899 Other long term (current) drug therapy: Secondary | ICD-10-CM | POA: Diagnosis not present

## 2017-07-11 DIAGNOSIS — E785 Hyperlipidemia, unspecified: Secondary | ICD-10-CM | POA: Diagnosis not present

## 2017-07-11 DIAGNOSIS — F411 Generalized anxiety disorder: Secondary | ICD-10-CM | POA: Diagnosis not present

## 2017-07-11 DIAGNOSIS — I10 Essential (primary) hypertension: Secondary | ICD-10-CM | POA: Diagnosis not present

## 2017-07-11 DIAGNOSIS — M7062 Trochanteric bursitis, left hip: Secondary | ICD-10-CM | POA: Diagnosis not present

## 2017-07-11 DIAGNOSIS — E1169 Type 2 diabetes mellitus with other specified complication: Secondary | ICD-10-CM | POA: Diagnosis not present

## 2017-07-11 MED FILL — VICTOZA 18 MG/3 ML INJECT P: 18 | 90 days supply | Qty: 27 | Fill #0

## 2017-07-17 MED FILL — PENTIPS 31G X 5 MM MISC: 31G X 5 MM | 90 days supply | Qty: 100 | Fill #0

## 2017-07-18 MED FILL — FREESTYLE LANCETS: 90 days supply | Qty: 100 | Fill #1

## 2017-07-18 MED FILL — FREESTYLE LITE TEST STRIP: 50 days supply | Qty: 50 | Fill #1

## 2017-08-12 ENCOUNTER — Other Ambulatory Visit: Payer: Self-pay | Admitting: *Deleted

## 2017-08-12 NOTE — Patient Outreach (Addendum)
Left message on [facility] mobile number and also sent a secure e-mail to her [facility] e-mail address advising her that disease self-management services will be transitioned from the Link To Wellness program to either [facility] or [facility] in 2019. Also advised [name] that a letter will be mailed to the home residence with details of this transition.  Will close case to Link To Wellness diabetes program.   Rhonda Garza sent an e-mail reply to this RNCM at 6:59 pm expressing appreciation for the help provided to her by this RNCM via the Link To Wellness program and indicating she understands the transition of disease management services from [facility] To Wellness to either [employer] or [employer] in 2019.  Barrington Ellison RN,CCM,CDE [place] Management Coordinator Link To Wellness and [employer] [phone] Office Fax [phone]

## 2017-08-14 MED FILL — ESCITALOPRAM 10 MG TABLET: 10 | 90 days supply | Qty: 135 | Fill #0

## 2017-09-01 MED FILL — SIMVASTATIN 40 MG TABLET: 40 | 90 days supply | Qty: 90 | Fill #0

## 2017-09-01 MED FILL — VALSARTAN-HCTZ 160-25 MG TA: 160-25 | 60 days supply | Qty: 60 | Fill #2

## 2017-09-01 MED FILL — metFORMIN HCL 1000 MG TABS: 1000 | 90 days supply | Qty: 180 | Fill #0

## 2017-10-09 MED FILL — VICTOZA 18 MG/3 ML INJECT P: 18 | 90 days supply | Qty: 27 | Fill #1

## 2017-10-09 MED FILL — PENTIPS 31G X 5 MM MISC: 31G X 5 MM | 90 days supply | Qty: 100 | Fill #1

## 2017-10-14 ENCOUNTER — Other Ambulatory Visit: Payer: Self-pay | Admitting: Family Medicine

## 2017-10-14 DIAGNOSIS — Z1231 Encounter for screening mammogram for malignant neoplasm of breast: Secondary | ICD-10-CM

## 2017-10-20 DIAGNOSIS — R5383 Other fatigue: Secondary | ICD-10-CM | POA: Diagnosis not present

## 2017-10-20 DIAGNOSIS — E119 Type 2 diabetes mellitus without complications: Secondary | ICD-10-CM | POA: Diagnosis not present

## 2017-10-20 DIAGNOSIS — Z79899 Other long term (current) drug therapy: Secondary | ICD-10-CM | POA: Diagnosis not present

## 2017-10-20 DIAGNOSIS — E785 Hyperlipidemia, unspecified: Secondary | ICD-10-CM | POA: Diagnosis not present

## 2017-10-20 DIAGNOSIS — N912 Amenorrhea, unspecified: Secondary | ICD-10-CM | POA: Diagnosis not present

## 2017-10-20 DIAGNOSIS — E1169 Type 2 diabetes mellitus with other specified complication: Secondary | ICD-10-CM | POA: Diagnosis not present

## 2017-10-22 DIAGNOSIS — I10 Essential (primary) hypertension: Secondary | ICD-10-CM | POA: Diagnosis not present

## 2017-10-22 DIAGNOSIS — N644 Mastodynia: Secondary | ICD-10-CM | POA: Diagnosis not present

## 2017-10-22 DIAGNOSIS — Z0000 Encounter for general adult medical examination without abnormal findings: Secondary | ICD-10-CM | POA: Diagnosis not present

## 2017-10-22 DIAGNOSIS — R5383 Other fatigue: Secondary | ICD-10-CM | POA: Diagnosis not present

## 2017-10-22 DIAGNOSIS — E1169 Type 2 diabetes mellitus with other specified complication: Secondary | ICD-10-CM | POA: Diagnosis not present

## 2017-10-22 DIAGNOSIS — N912 Amenorrhea, unspecified: Secondary | ICD-10-CM | POA: Diagnosis not present

## 2017-10-22 DIAGNOSIS — Z6841 Body Mass Index (BMI) 40.0 and over, adult: Secondary | ICD-10-CM | POA: Diagnosis not present

## 2017-10-22 DIAGNOSIS — E785 Hyperlipidemia, unspecified: Secondary | ICD-10-CM | POA: Diagnosis not present

## 2017-10-26 ENCOUNTER — Other Ambulatory Visit: Payer: Self-pay | Admitting: Legal Medicine

## 2017-10-26 DIAGNOSIS — N6320 Unspecified lump in the left breast, unspecified quadrant: Secondary | ICD-10-CM

## 2017-10-31 ENCOUNTER — Ambulatory Visit
Admission: RE | Admit: 2017-10-31 | Discharge: 2017-10-31 | Disposition: A | Discharge status: Home / Self Care | Payer: 59 | Source: Ambulatory Visit | Attending: Legal Medicine | Admitting: Legal Medicine

## 2017-10-31 ENCOUNTER — Ambulatory Visit: Payer: 59

## 2017-10-31 DIAGNOSIS — N6320 Unspecified lump in the left breast, unspecified quadrant: Secondary | ICD-10-CM

## 2017-10-31 DIAGNOSIS — R928 Other abnormal and inconclusive findings on diagnostic imaging of breast: Secondary | ICD-10-CM | POA: Diagnosis not present

## 2017-11-03 MED FILL — VALSARTAN-HCTZ 160-25 MG TA: 160-25 | 90 days supply | Qty: 90 | Fill #0

## 2017-11-03 MED FILL — ESCITALOPRAM 10 MG TABLET: 10 | 90 days supply | Qty: 135 | Fill #1

## 2017-11-04 DIAGNOSIS — Z0100 Encounter for examination of eyes and vision without abnormal findings: Secondary | ICD-10-CM | POA: Diagnosis not present

## 2017-11-06 ENCOUNTER — Other Ambulatory Visit: Payer: Self-pay | Admitting: Legal Medicine

## 2017-11-06 DIAGNOSIS — N644 Mastodynia: Secondary | ICD-10-CM

## 2017-11-13 MED FILL — ACCU-CHEK GUIDE TEST STRIP: 50 days supply | Qty: 50 | Fill #0

## 2017-11-13 MED FILL — ACCU-CHEK FASTCLIX LANCETS: 90 days supply | Qty: 102 | Fill #0

## 2017-12-10 MED FILL — metFORMIN HCL 1000 MG TABS: 1000 | 90 days supply | Qty: 180 | Fill #1

## 2017-12-10 MED FILL — SIMVASTATIN 40 MG TABLET: 40 | 90 days supply | Qty: 90 | Fill #1

## 2018-01-19 DIAGNOSIS — F5081 Binge eating disorder: Secondary | ICD-10-CM | POA: Diagnosis not present

## 2018-01-19 DIAGNOSIS — E785 Hyperlipidemia, unspecified: Secondary | ICD-10-CM | POA: Diagnosis not present

## 2018-01-19 DIAGNOSIS — F411 Generalized anxiety disorder: Secondary | ICD-10-CM | POA: Diagnosis not present

## 2018-01-19 DIAGNOSIS — I10 Essential (primary) hypertension: Secondary | ICD-10-CM | POA: Diagnosis not present

## 2018-01-19 DIAGNOSIS — E119 Type 2 diabetes mellitus without complications: Secondary | ICD-10-CM | POA: Diagnosis not present

## 2018-01-19 DIAGNOSIS — Z79899 Other long term (current) drug therapy: Secondary | ICD-10-CM | POA: Diagnosis not present

## 2018-01-19 MED FILL — ESCITALOPRAM 10 MG TABLET: 10 | 90 days supply | Qty: 135 | Fill #0

## 2018-01-19 MED FILL — VALSARTAN-HCTZ 160-25 MG TA: 160-25 | 90 days supply | Qty: 90 | Fill #0

## 2018-01-19 MED FILL — ALPRAZolam 0.25 MG TABS: 0.25 | 30 days supply | Qty: 30 | Fill #0

## 2018-01-19 MED FILL — VICTOZA 18 MG/3 ML INJECT P: 18 | 90 days supply | Qty: 27 | Fill #0

## 2018-01-19 MED FILL — VYVANSE 20 MG CAPSULE: 20 | 30 days supply | Qty: 30 | Fill #0

## 2018-01-20 DIAGNOSIS — Z30432 Encounter for removal of intrauterine contraceptive device: Secondary | ICD-10-CM | POA: Diagnosis not present

## 2018-01-20 DIAGNOSIS — Z3040 Encounter for surveillance of contraceptives, unspecified: Secondary | ICD-10-CM | POA: Diagnosis not present

## 2018-01-20 DIAGNOSIS — Z6841 Body Mass Index (BMI) 40.0 and over, adult: Secondary | ICD-10-CM | POA: Diagnosis not present

## 2018-01-20 DIAGNOSIS — Z01419 Encounter for gynecological examination (general) (routine) without abnormal findings: Secondary | ICD-10-CM | POA: Diagnosis not present

## 2018-02-26 DIAGNOSIS — F5081 Binge eating disorder: Secondary | ICD-10-CM | POA: Diagnosis not present

## 2018-02-26 DIAGNOSIS — N760 Acute vaginitis: Secondary | ICD-10-CM | POA: Diagnosis not present

## 2018-02-26 DIAGNOSIS — Z202 Contact with and (suspected) exposure to infections with a predominantly sexual mode of transmission: Secondary | ICD-10-CM | POA: Diagnosis not present

## 2018-02-26 DIAGNOSIS — N898 Other specified noninflammatory disorders of vagina: Secondary | ICD-10-CM | POA: Diagnosis not present

## 2018-02-26 DIAGNOSIS — B373 Candidiasis of vulva and vagina: Secondary | ICD-10-CM | POA: Diagnosis not present

## 2018-02-26 DIAGNOSIS — R87810 Cervical high risk human papillomavirus (HPV) DNA test positive: Secondary | ICD-10-CM | POA: Insufficient documentation

## 2018-02-26 DIAGNOSIS — B9689 Other specified bacterial agents as the cause of diseases classified elsewhere: Secondary | ICD-10-CM | POA: Diagnosis not present

## 2018-02-26 DIAGNOSIS — Z3040 Encounter for surveillance of contraceptives, unspecified: Secondary | ICD-10-CM | POA: Diagnosis not present

## 2018-02-26 MED FILL — VYVANSE 30 MG CAPSULE: 30 | 30 days supply | Qty: 30 | Fill #0

## 2018-02-27 MED FILL — metroNIDAZOLE 500 MG TABS: 500 | 7 days supply | Qty: 14 | Fill #0

## 2018-02-27 MED FILL — FLUCONAZOLE 150 MG TABS: 150 | 4 days supply | Qty: 2 | Fill #0

## 2018-03-25 MED FILL — SIMVASTATIN 40 MG TABLET: 40 | 90 days supply | Qty: 90 | Fill #0

## 2018-03-25 MED FILL — metFORMIN HCL 1000 MG TABS: 1000 | 90 days supply | Qty: 180 | Fill #0

## 2018-04-21 MED FILL — VYVANSE 30 MG CAPSULE: 30 | 30 days supply | Qty: 30 | Fill #0

## 2018-04-21 MED FILL — PENTIPS 31G X 5 MM MISC: 31G X 5 MM | 90 days supply | Qty: 100 | Fill #2

## 2018-05-11 MED FILL — VALSARTAN-HCTZ 160-25 MG TA: 160-25 | 90 days supply | Qty: 90 | Fill #1

## 2018-05-19 DIAGNOSIS — D2262 Melanocytic nevi of left upper limb, including shoulder: Secondary | ICD-10-CM | POA: Diagnosis not present

## 2018-05-19 DIAGNOSIS — D2272 Melanocytic nevi of left lower limb, including hip: Secondary | ICD-10-CM | POA: Diagnosis not present

## 2018-05-19 DIAGNOSIS — L821 Other seborrheic keratosis: Secondary | ICD-10-CM | POA: Diagnosis not present

## 2018-05-19 DIAGNOSIS — D1801 Hemangioma of skin and subcutaneous tissue: Secondary | ICD-10-CM | POA: Diagnosis not present

## 2018-05-19 DIAGNOSIS — D2371 Other benign neoplasm of skin of right lower limb, including hip: Secondary | ICD-10-CM | POA: Diagnosis not present

## 2018-05-19 DIAGNOSIS — L918 Other hypertrophic disorders of the skin: Secondary | ICD-10-CM | POA: Diagnosis not present

## 2018-05-19 DIAGNOSIS — D2261 Melanocytic nevi of right upper limb, including shoulder: Secondary | ICD-10-CM | POA: Diagnosis not present

## 2018-05-19 DIAGNOSIS — D225 Melanocytic nevi of trunk: Secondary | ICD-10-CM | POA: Diagnosis not present

## 2018-06-02 DIAGNOSIS — L0291 Cutaneous abscess, unspecified: Secondary | ICD-10-CM | POA: Diagnosis not present

## 2018-06-11 DIAGNOSIS — Z79899 Other long term (current) drug therapy: Secondary | ICD-10-CM | POA: Diagnosis not present

## 2018-06-11 DIAGNOSIS — F5081 Binge eating disorder: Secondary | ICD-10-CM | POA: Diagnosis not present

## 2018-06-11 DIAGNOSIS — E119 Type 2 diabetes mellitus without complications: Secondary | ICD-10-CM | POA: Diagnosis not present

## 2018-06-11 MED FILL — VYVANSE 30 MG CAPSULE: 30 | 30 days supply | Qty: 30 | Fill #0

## 2018-06-15 MED FILL — ESCITALOPRAM 10 MG TABLET: 10 | 30 days supply | Qty: 45 | Fill #1

## 2018-06-15 MED FILL — VICTOZA 18 MG/3 ML INJECT P: 18 | 30 days supply | Qty: 9 | Fill #1

## 2018-06-19 MED FILL — SIMVASTATIN 40 MG TABLET: 40 | 30 days supply | Qty: 30 | Fill #1

## 2018-06-19 MED FILL — metFORMIN HCL 1000 MG TABS: 1000 | 30 days supply | Qty: 60 | Fill #1

## 2018-08-10 MED FILL — ESCITALOPRAM 10 MG TABLET: 10 | 60 days supply | Qty: 90 | Fill #2

## 2018-08-10 MED FILL — metFORMIN HCL 1000 MG TABS: 1000 | 60 days supply | Qty: 120 | Fill #2

## 2018-08-10 MED FILL — SIMVASTATIN 40 MG TABLET: 40 | 60 days supply | Qty: 60 | Fill #2

## 2018-08-10 MED FILL — VALSARTAN-HCTZ 160-25 MG TA: 160-25 | 60 days supply | Qty: 60 | Fill #0

## 2018-08-12 MED FILL — VYVANSE 30 MG CAPSULE: 30 | 30 days supply | Qty: 30 | Fill #0

## 2018-10-07 DIAGNOSIS — R6889 Other general symptoms and signs: Secondary | ICD-10-CM | POA: Diagnosis not present

## 2018-10-13 MED FILL — metFORMIN HCL 1000 MG TABS: 1000 | 90 days supply | Qty: 180 | Fill #0

## 2018-10-13 MED FILL — SIMVASTATIN 40 MG TABLET: 40 | 90 days supply | Qty: 90 | Fill #0

## 2018-10-13 MED FILL — ESCITALOPRAM 10 MG TABLET: 10 | 90 days supply | Qty: 135 | Fill #0

## 2018-10-14 MED FILL — VYVANSE 30 MG CAPSULE: 30 | 30 days supply | Qty: 30 | Fill #0

## 2018-10-19 MED FILL — VALSARTAN-HCTZ 160-25 MG TA: 160-25 | 30 days supply | Qty: 30 | Fill #1

## 2018-10-29 ENCOUNTER — Other Ambulatory Visit: Payer: Self-pay | Admitting: Family Medicine

## 2018-10-29 DIAGNOSIS — Z1231 Encounter for screening mammogram for malignant neoplasm of breast: Secondary | ICD-10-CM

## 2018-10-30 DIAGNOSIS — C4441 Basal cell carcinoma of skin of scalp and neck: Secondary | ICD-10-CM | POA: Diagnosis not present

## 2018-10-30 DIAGNOSIS — L738 Other specified follicular disorders: Secondary | ICD-10-CM | POA: Diagnosis not present

## 2018-11-17 DIAGNOSIS — C4441 Basal cell carcinoma of skin of scalp and neck: Secondary | ICD-10-CM | POA: Diagnosis not present

## 2018-11-17 DIAGNOSIS — Z85828 Personal history of other malignant neoplasm of skin: Secondary | ICD-10-CM | POA: Diagnosis not present

## 2018-11-24 MED FILL — VALSARTAN-HCTZ 160-25 MG TA: 160-25 | 90 days supply | Qty: 90 | Fill #0

## 2018-11-27 ENCOUNTER — Other Ambulatory Visit: Payer: Self-pay

## 2018-11-27 ENCOUNTER — Ambulatory Visit
Admission: RE | Admit: 2018-11-27 | Discharge: 2018-11-27 | Disposition: A | Discharge status: Home / Self Care | Payer: 59 | Source: Ambulatory Visit | Attending: Family Medicine | Admitting: Family Medicine

## 2018-11-27 DIAGNOSIS — Z1231 Encounter for screening mammogram for malignant neoplasm of breast: Secondary | ICD-10-CM | POA: Diagnosis not present

## 2018-12-03 DIAGNOSIS — R112 Nausea with vomiting, unspecified: Secondary | ICD-10-CM | POA: Diagnosis not present

## 2018-12-03 DIAGNOSIS — N132 Hydronephrosis with renal and ureteral calculous obstruction: Secondary | ICD-10-CM | POA: Diagnosis not present

## 2018-12-03 DIAGNOSIS — D72825 Bandemia: Secondary | ICD-10-CM | POA: Diagnosis not present

## 2018-12-03 DIAGNOSIS — N644 Mastodynia: Secondary | ICD-10-CM | POA: Diagnosis not present

## 2018-12-03 DIAGNOSIS — K760 Fatty (change of) liver, not elsewhere classified: Secondary | ICD-10-CM | POA: Diagnosis not present

## 2018-12-03 DIAGNOSIS — R1031 Right lower quadrant pain: Secondary | ICD-10-CM | POA: Diagnosis not present

## 2018-12-04 DIAGNOSIS — R8290 Unspecified abnormal findings in urine: Secondary | ICD-10-CM | POA: Diagnosis not present

## 2018-12-04 DIAGNOSIS — R109 Unspecified abdominal pain: Secondary | ICD-10-CM | POA: Diagnosis not present

## 2018-12-04 MED FILL — CEPHALEXIN 500 MG CAPSULE: 500 | 7 days supply | Qty: 21 | Fill #0

## 2018-12-29 MED FILL — UNIFINE PENTIPS 31GX3/16: 31G X 5 MM | 90 days supply | Qty: 100 | Fill #0

## 2018-12-29 MED FILL — UNIFINE PENTIPS 31GX3/16": 31G X 5 MM | 90 days supply | Qty: 100 | Fill #0

## 2019-02-12 MED FILL — SIMVASTATIN 40 MG TABLET: 40 | 90 days supply | Qty: 90 | Fill #0 | Status: TO

## 2019-02-12 MED FILL — VICTOZA 18 MG/3 ML INJECT P: 18 | 90 days supply | Qty: 27 | Fill #0

## 2019-02-15 MED FILL — metFORMIN HCL 1000 MG TABS: 1000 | 90 days supply | Qty: 180 | Fill #0

## 2019-02-26 DIAGNOSIS — E785 Hyperlipidemia, unspecified: Secondary | ICD-10-CM | POA: Diagnosis not present

## 2019-02-26 DIAGNOSIS — Z79899 Other long term (current) drug therapy: Secondary | ICD-10-CM | POA: Diagnosis not present

## 2019-02-26 DIAGNOSIS — E119 Type 2 diabetes mellitus without complications: Secondary | ICD-10-CM | POA: Diagnosis not present

## 2019-03-01 MED FILL — VALSARTAN-HCTZ 160-25 MG TA: 160-25 | 90 days supply | Qty: 90 | Fill #0

## 2019-03-02 DIAGNOSIS — Z6841 Body Mass Index (BMI) 40.0 and over, adult: Secondary | ICD-10-CM | POA: Diagnosis not present

## 2019-03-02 DIAGNOSIS — F5081 Binge eating disorder: Secondary | ICD-10-CM | POA: Diagnosis not present

## 2019-03-02 DIAGNOSIS — E782 Mixed hyperlipidemia: Secondary | ICD-10-CM | POA: Diagnosis not present

## 2019-03-02 DIAGNOSIS — I10 Essential (primary) hypertension: Secondary | ICD-10-CM | POA: Diagnosis not present

## 2019-03-02 DIAGNOSIS — F411 Generalized anxiety disorder: Secondary | ICD-10-CM | POA: Diagnosis not present

## 2019-03-02 DIAGNOSIS — Z85828 Personal history of other malignant neoplasm of skin: Secondary | ICD-10-CM | POA: Insufficient documentation

## 2019-03-02 DIAGNOSIS — C4441 Basal cell carcinoma of skin of scalp and neck: Secondary | ICD-10-CM | POA: Diagnosis not present

## 2019-03-02 DIAGNOSIS — E119 Type 2 diabetes mellitus without complications: Secondary | ICD-10-CM | POA: Diagnosis not present

## 2019-03-15 MED FILL — UNIFINE PENTIPS 31GX3/16: 31G X 5 MM | 90 days supply | Qty: 100 | Fill #1

## 2019-03-15 MED FILL — VYVANSE 30 MG CAPSULE: 30 | 30 days supply | Qty: 30 | Fill #0

## 2019-03-15 MED FILL — UNIFINE PENTIPS 31GX3/16": 31G X 5 MM | 90 days supply | Qty: 100 | Fill #1

## 2019-03-15 MED FILL — VALSARTAN-HCTZ 320-25 MG TA: 320-25 | 30 days supply | Qty: 30 | Fill #0

## 2019-04-15 MED FILL — VALSARTAN-HCTZ 320-25 MG TA: 320-25 | 30 days supply | Qty: 30 | Fill #0

## 2019-04-30 DIAGNOSIS — F5081 Binge eating disorder: Secondary | ICD-10-CM | POA: Diagnosis not present

## 2019-04-30 DIAGNOSIS — E782 Mixed hyperlipidemia: Secondary | ICD-10-CM | POA: Diagnosis not present

## 2019-04-30 DIAGNOSIS — Z79899 Other long term (current) drug therapy: Secondary | ICD-10-CM | POA: Diagnosis not present

## 2019-04-30 DIAGNOSIS — I10 Essential (primary) hypertension: Secondary | ICD-10-CM | POA: Diagnosis not present

## 2019-04-30 DIAGNOSIS — C4441 Basal cell carcinoma of skin of scalp and neck: Secondary | ICD-10-CM | POA: Diagnosis not present

## 2019-04-30 DIAGNOSIS — F411 Generalized anxiety disorder: Secondary | ICD-10-CM | POA: Diagnosis not present

## 2019-04-30 DIAGNOSIS — E119 Type 2 diabetes mellitus without complications: Secondary | ICD-10-CM | POA: Diagnosis not present

## 2019-04-30 DIAGNOSIS — R87810 Cervical high risk human papillomavirus (HPV) DNA test positive: Secondary | ICD-10-CM | POA: Diagnosis not present

## 2019-04-30 MED FILL — ESCITALOPRAM 10 MG TABLET: 10 | 90 days supply | Qty: 135 | Fill #0

## 2019-04-30 MED FILL — SIMVASTATIN 40 MG TABLET: 40 | 90 days supply | Qty: 90 | Fill #0

## 2019-04-30 MED FILL — VYVANSE 30 MG CAPSULE: 30 | 90 days supply | Qty: 90 | Fill #0

## 2019-05-17 MED FILL — VALSARTAN-HCTZ 320-25 MG TA: 320-25 | 30 days supply | Qty: 30 | Fill #1

## 2019-05-17 MED FILL — metFORMIN HCL 1000 MG TABS: 1000 | 90 days supply | Qty: 180 | Fill #0

## 2019-05-17 MED FILL — VICTOZA 18 MG/3 ML INJECT P: 18 | 90 days supply | Qty: 27 | Fill #0

## 2019-05-26 DIAGNOSIS — Z1151 Encounter for screening for human papillomavirus (HPV): Secondary | ICD-10-CM | POA: Diagnosis not present

## 2019-05-26 DIAGNOSIS — Z6841 Body Mass Index (BMI) 40.0 and over, adult: Secondary | ICD-10-CM | POA: Diagnosis not present

## 2019-05-26 DIAGNOSIS — Z01419 Encounter for gynecological examination (general) (routine) without abnormal findings: Secondary | ICD-10-CM | POA: Diagnosis not present

## 2019-06-09 MED FILL — VICTOZA 18 MG/3 ML INJECT P: 18 | 90 days supply | Qty: 27 | Fill #1

## 2019-06-09 MED FILL — VICTOZA 18 MG/3 ML INJECT P: 18 | 90 days supply | Qty: 27 | Fill #0

## 2019-06-10 MED FILL — VALSARTAN-HCTZ 320-25 MG TA: 320-25 | 30 days supply | Qty: 30 | Fill #2

## 2019-06-28 DIAGNOSIS — H5213 Myopia, bilateral: Secondary | ICD-10-CM | POA: Diagnosis not present

## 2019-07-15 MED FILL — VALSARTAN-HCTZ 320-25 MG TA: 320-25 | 30 days supply | Qty: 30 | Fill #3

## 2019-07-22 MED FILL — JARDIANCE 25 MG TABLET: 25 | 90 days supply | Qty: 90 | Fill #0

## 2019-07-30 MED FILL — ESCITALOPRAM 10 MG TABLET: 10 | 90 days supply | Qty: 135 | Fill #1

## 2019-07-30 MED FILL — JARDIANCE 25 MG TABLET: 25 | 90 days supply | Qty: 90 | Fill #0

## 2019-07-30 MED FILL — SIMVASTATIN 40 MG TABLET: 40 | 90 days supply | Qty: 90 | Fill #1

## 2019-08-19 MED FILL — metFORMIN HCL 1000 MG TABS: 1000 | 90 days supply | Qty: 180 | Fill #1

## 2019-08-19 MED FILL — VALSARTAN-HCTZ 320-25 MG TA: 320-25 | 30 days supply | Qty: 30 | Fill #4

## 2019-09-15 MED FILL — VALSARTAN-HCTZ 320-25 MG TA: 320-25 | 30 days supply | Qty: 30 | Fill #5

## 2019-09-27 ENCOUNTER — Other Ambulatory Visit: Payer: Self-pay

## 2019-09-27 ENCOUNTER — Ambulatory Visit (INDEPENDENT_AMBULATORY_CARE_PROVIDER_SITE_OTHER): Payer: 59 | Admitting: Bariatrics

## 2019-09-27 ENCOUNTER — Encounter (INDEPENDENT_AMBULATORY_CARE_PROVIDER_SITE_OTHER): Payer: Self-pay | Admitting: Bariatrics

## 2019-09-27 VITALS — BP 117/77 | HR 83 | Temp 98.5°F | Ht 64.0 in | Wt 260.0 lb

## 2019-09-27 DIAGNOSIS — E119 Type 2 diabetes mellitus without complications: Secondary | ICD-10-CM

## 2019-09-27 DIAGNOSIS — E538 Deficiency of other specified B group vitamins: Secondary | ICD-10-CM

## 2019-09-27 DIAGNOSIS — R5383 Other fatigue: Secondary | ICD-10-CM | POA: Diagnosis not present

## 2019-09-27 DIAGNOSIS — Z9189 Other specified personal risk factors, not elsewhere classified: Secondary | ICD-10-CM | POA: Diagnosis not present

## 2019-09-27 DIAGNOSIS — F3289 Other specified depressive episodes: Secondary | ICD-10-CM | POA: Diagnosis not present

## 2019-09-27 DIAGNOSIS — R0602 Shortness of breath: Secondary | ICD-10-CM

## 2019-09-27 DIAGNOSIS — E7849 Other hyperlipidemia: Secondary | ICD-10-CM

## 2019-09-27 DIAGNOSIS — Z0289 Encounter for other administrative examinations: Secondary | ICD-10-CM

## 2019-09-27 DIAGNOSIS — E559 Vitamin D deficiency, unspecified: Secondary | ICD-10-CM

## 2019-09-27 DIAGNOSIS — Z6841 Body Mass Index (BMI) 40.0 and over, adult: Secondary | ICD-10-CM

## 2019-09-28 LAB — T3: T3, Total: 142 ng/dL (ref 71–180)

## 2019-09-28 LAB — COMPREHENSIVE METABOLIC PANEL
ALT: 31 IU/L (ref 0–32)
AST: 20 IU/L (ref 0–40)
Albumin/Globulin Ratio: 1.8 (ref 1.2–2.2)
Albumin: 4.6 g/dL (ref 3.8–4.9)
Alkaline Phosphatase: 80 IU/L (ref 39–117)
BUN/Creatinine Ratio: 19 (ref 9–23)
BUN: 15 mg/dL (ref 6–24)
Bilirubin Total: 0.2 mg/dL (ref 0.0–1.2)
CO2: 26 mmol/L (ref 20–29)
Calcium: 9.8 mg/dL (ref 8.7–10.2)
Chloride: 97 mmol/L (ref 96–106)
Creatinine, Ser: 0.77 mg/dL (ref 0.57–1.00)
GFR calc Af Amer: 103 mL/min/{1.73_m2} (ref >59)
GFR calc non Af Amer: 89 mL/min/{1.73_m2} (ref >59)
Globulin, Total: 2.6 g/dL (ref 1.5–4.5)
Glucose: 142 mg/dL — ABNORMAL HIGH (ref 65–99)
Potassium: 3.9 mmol/L (ref 3.5–5.2)
Sodium: 142 mmol/L (ref 134–144)
Total Protein: 7.2 g/dL (ref 6.0–8.5)

## 2019-09-28 LAB — HEMOGLOBIN A1C
Est. average glucose Bld gHb Est-mCnc: 180 mg/dL
Hgb A1c MFr Bld: 7.9 % — ABNORMAL HIGH (ref 4.8–5.6)

## 2019-09-28 LAB — TSH: TSH: 2.46 u[IU]/mL (ref 0.450–4.500)

## 2019-09-28 LAB — LIPID PANEL WITH LDL/HDL RATIO
Cholesterol, Total: 158 mg/dL (ref 100–199)
HDL: 48 mg/dL (ref >39)
LDL Chol Calc (NIH): 82 mg/dL (ref 0–99)
LDL/HDL Ratio: 1.7 ratio (ref 0.0–3.2)
Triglycerides: 163 mg/dL — ABNORMAL HIGH (ref 0–149)
VLDL Cholesterol Cal: 28 mg/dL (ref 5–40)

## 2019-09-28 LAB — VITAMIN B12: Vitamin B-12: 284 pg/mL (ref 232–1245)

## 2019-09-28 LAB — INSULIN, RANDOM: INSULIN: 31.4 u[IU]/mL — ABNORMAL HIGH (ref 2.6–24.9)

## 2019-09-28 LAB — T4, FREE: Free T4: 1.13 ng/dL (ref 0.82–1.77)

## 2019-09-28 LAB — VITAMIN D 25 HYDROXY (VIT D DEFICIENCY, FRACTURES): Vit D, 25-Hydroxy: 34.7 ng/mL (ref 30.0–100.0)

## 2019-09-28 NOTE — Progress Notes (Signed)
Chief Complaint:   OBESITY Rhonda Garza (MR# [id number]) is a 54 y.o. female who presents for evaluation and treatment of obesity and related comorbidities. Current BMI is Body mass index is 44.63 kg/m.Marland Kitchen Rhonda Garza has been struggling with her weight for many years and has been unsuccessful in either losing weight, maintaining weight loss, or reaching her healthy weight goal.  Rhonda Garza states she is currently in the action stage of change and ready to dedicate time achieving and maintaining a healthier weight. Rhonda Garza is interested in becoming our patient and working on intensive lifestyle modifications including (but not limited to) diet and exercise for weight loss.  Rhonda Garza's habits were reviewed today and are as follows: Her family mostly eats meals together, she thinks her family mostly will eat healthier with her, her desired weight loss is 110 pounds, she has been heavy for 22 years, she started gaining weight during her first pregnancy, her heaviest weight ever was 284 pounds, she craves sweets and bread, she snacks frequently in the evenings, she skips breakfast frequently, she is frequently drinking liquids with calories, she frequently makes poor food choices, she has problems with excessive hunger, she frequently eats larger portions than normal, she has binge eating behaviors and she struggles with emotional eating.  Rhonda Garza likes to [employer] and denies obstacles. She craves sweets and bread. She does snack at night.  Depression Screen Emonnie's Food and Mood (modified PHQ-9) score was 14.  Depression screen PHQ 2/9 09/27/2019  Decreased Interest 3  Down, Depressed, Hopeless 3  PHQ - 2 Score 6  Altered sleeping 3  Tired, decreased energy 3  Change in appetite 3  Feeling bad or failure about yourself  2  Trouble concentrating 3  Moving slowly or fidgety/restless 3  Suicidal thoughts 0  PHQ-9 Score 23  Difficult doing work/chores Very difficult   Subjective:   Other fatigue.  Rhonda Garza feels her energy is lower than it should be. This has worsened with weight gain and has worsened recently. Rhonda Garza admits to daytime somnolence and admits to waking up still tired. Patient is at risk for obstructive sleep apnea. Patent has a history of symptoms of daytime fatigue and Epworth sleepiness scale. Patient generally gets 6-8 hours of sleep per night, and states they generally do not sleep well most nights. Snoring is present. Apneic episodes are present. Epworth Sleepiness Score is 14.  Shortness of breath on exertion. Piya notes increasing shortness of breath with exercising and seems to be worsening over time with weight gain. She notes getting out of breath sooner with activity than she used to. This has gotten worse recently. Madysin denies shortness of breath at rest or orthopnea.  Type 2 diabetes mellitus without complication, without long-term current use of insulin (Rhonda Garza).  Almitra is taking Victoza, metformin and Jardiance x1 month. She reports having had a UTI x2.   Other hyperlipidemia. Rhonda Garza is taking Zocor. No myalgias.   Vitamin D deficiency. Rhonda Garza is taking Vitamin D OTC 2,000 units.  Other depression. Rhonda Garza is struggling with emotional eating and using food for comfort to the extent that it is negatively impacting her health. She often snacks when she is not hungry. Rhonda Garza sometimes feels she is out of control and then feels guilty that she made poor food choices. She has been working on behavior modification techniques to help reduce her emotional eating and has been somewhat successful. She shows no sign of suicidal or homicidal ideations.  Vitamin B 12 deficiency. Rhonda Garza is not  taking Vitamin B12.  At risk for hypoglycemia. Rhonda Garza is at increased risk for hypoglycemia due to changes in diet, diagnosis of diabetes, and/or insulin use. Rhonda Garza is not not currently taking insulin.    Assessment/Plan:   Other fatigue. Zaryia was informed fatigue may be related to  obesity, depression or many other causes. Labs will be ordered, and in the meanwhile Kani has agreed to work on diet, exercise and weight loss. EKG 12-Lead, T3, T4, free, TSH were ordered.  Shortness of breath on exertion. Kaycie's shortness of breath appears to be obesity related and exercise induced. She has agreed to work on weight loss and gradually increase exercise to treat her exercise induced shortness of breath. Will continue to monitor closely. She will gradually increase activities/exercise.  Type 2 diabetes mellitus without complication, without long-term current use of insulin ([place]). Lachlyn has been given diabetes education by myself today. Good blood sugar control is important to decrease the likelihood of diabetic complications such as nephropathy, neuropathy, limb loss, blindness, coronary artery disease, and death. Intensive lifestyle modification including diet, exercise and weight loss were discussed as the first line treatment for diabetes. She will continue medications and will call her PCP in reference to Jardiance (UTI x2). Comprehensive Metabolic Panel (CMET), HgB A1c, Insulin, random were ordered.  Other hyperlipidemia. Cardiovascular risk and specific lipid/LDL goals reviewed.  We discussed several lifestyle modifications today and Ieshia will continue to work on diet, exercise and weight loss efforts. Orders and follow up as documented in patient record. Continue Zocor. Decrease saturated fats (read labels), increase MUFA's and PUFA's. Lipid Panel With LDL/HDL Ratio were ordered.  Counseling Intensive lifestyle modifications are the first line treatment for this issue.  Dietary changes: Increase soluble fiber. Decrease simple carbohydrates.  Exercise changes: Moderate to vigorous-intensity aerobic activity 150 minutes per week if tolerated.  Lipid-lowering medications: see documented in medical record.   Vitamin D deficiency. Low Vitamin D level contributes to fatigue  and are associated with obesity, breast, and colon cancer. She will have routine testing of vitamin D today. Vitamin D (25 hydroxy) ordered.   Other depression. Behavior modification techniques were discussed today to help Rhonda Garza deal with her emotional/non-hunger eating behaviors.  Orders and follow-up as documented in patient record. Rhonda Garza will be referred to [name], our bariatric psychologist, for evaluation.  Vitamin B 12 deficiency. The diagnosis was reviewed with the patient. Counseling provided today, see below. We will continue to monitor. Orders and follow up as documented in patient record. B12 level ordered.  Counseling  The body needs vitamin B12: to make red blood cells; to make DNA; and to help the nerves work properly so they can carry messages from the brain to the body.   The main causes of vitamin B12 deficiency include dietary deficiency, digestive diseases, pernicious anemia, and having a surgery in which part of the stomach or small intestine is removed.   Certain medicines can make it harder for the body to absorb vitamin B12. These medicines include: heartburn medications; some antibiotics; some medications used to treat diabetes, gout, and high cholesterol.   In some cases, there are no symptoms of this condition. If the condition leads to anemia or nerve damage, various symptoms can occur, such as weakness or fatigue, shortness of breath, and numbness or tingling in your hands and feet.    Treatment:  o May include taking vitamin B12 supplements.  o Avoid alcohol.  o Eat lots of healthy foods that contain vitamin B12: -  Beef, pork, chicken, [place], and organ meats, such as liver.  - Seafood: This includes clams, rainbow trout, salmon, tuna, and haddock. Eggs.  - Cereal and dairy products that are fortified: This means that vitamin B12 has been added to the food.   At risk for hypoglycemia. Rhonda Garza was given approximately 15 minutes of counseling today regarding  prevention of hypoglycemia and is at risk secondary to diabetes mellitus and dietary changes. She was advised of symptoms of hypoglycemia. Rhonda Garza was instructed to eat regular meals.   Class 3 severe obesity with serious comorbidity and body mass index (BMI) of 40.0 to 44.9 in adult, unspecified obesity type ([place]).   Rhonda Garza is currently in the action stage of change and her goal is to continue with weight loss efforts. I recommend Rhonda Garza begin the structured treatment plan as follows:  She has agreed to on the Category 3 Plan.  She will work on meal planning, intentional eating, and will stop all sugary drinks  We reviewed old labs from 03/18 and labs from [place] (lipid panel, CMP, and A1c).  We discussed the following exercise goals today: For substantial health benefits, adults should do at least 150 minutes (2 hours and 30 minutes) a week of moderate-intensity, or 75 minutes (1 hour and 15 minutes) a week of vigorous-intensity aerobic physical activity, or an equivalent combination of moderate- and vigorous-intensity aerobic activity. Aerobic activity should be performed in episodes of at least 10 minutes, and preferably, it should be spread throughout the week. Adults should also include muscle-strengthening activities that involve all major muscle groups on 2 or more days a week.   We discussed the following behavioral modification strategies today: increasing lean protein intake, decreasing simple carbohydrates, increasing vegetables, increasing water intake, decreasing eating out, no skipping meals, meal planning and cooking strategies, keeping healthy foods in the home, better snacking choices and emotional eating strategies.  She was informed of the importance of frequent follow-up visits to maximize her success with intensive lifestyle modifications for her multiple health conditions. She was informed we would discuss her lab results at her next visit unless there is a critical issue that  needs to be addressed sooner. Rhonda Garza agreed to keep her next visit at the agreed upon time to discuss these results.  Objective:   Blood pressure 117/77, pulse 83, temperature 98.5 F (36.9 C), temperature source Oral, height 5\' 4"  (1.626 m), weight 260 lb (117.9 kg), SpO2 96 %. Body mass index is 44.63 kg/m.  EKG: Sinus  Rhythm with a rate of 84 BPM. Low voltage in precordial leads most likely related to body habitus. Otherwise normal.  Indirect Calorimeter completed today shows a VO2 of 272 and a REE of 1895.  Her calculated basal metabolic rate is 0000000 thus her basal metabolic rate is better than expected.  General: Cooperative, alert, well developed, in no acute distress. HEENT: Conjunctivae and lids unremarkable. Neck: No thyromegaly.  Cardiovascular: Regular rhythm.  Lungs: Normal work of breathing. Extremities: No edema.  Neurologic: No focal deficits.   Lab Results  Component Value Date   CREATININE 0.77 09/27/2019   BUN 15 09/27/2019   NA 142 09/27/2019   K 3.9 09/27/2019   CL 97 09/27/2019   CO2 26 09/27/2019   Lab Results  Component Value Date   ALT 31 09/27/2019   AST 20 09/27/2019   ALKPHOS 80 09/27/2019   BILITOT 0.2 09/27/2019   Lab Results  Component Value Date   HGBA1C 7.9 (H) 09/27/2019   Lab  Results  Component Value Date   INSULIN 31.4 (H) 09/27/2019   Lab Results  Component Value Date   TSH 2.460 09/27/2019   Lab Results  Component Value Date   CHOL 158 09/27/2019   HDL 48 09/27/2019   LDLCALC 82 09/27/2019   TRIG 163 (H) 09/27/2019    Attestation Statements:   Reviewed by clinician on day of visit: allergies, medications, problem list, medical history, surgical history, family history, social history, and previous encounter notes.   Migdalia Dk, am acting as Location manager for [employer], DO  I have reviewed the above documentation for accuracy and completeness, and I agree with the above. Jearld Lesch, DO

## 2019-09-30 ENCOUNTER — Encounter (INDEPENDENT_AMBULATORY_CARE_PROVIDER_SITE_OTHER): Payer: Self-pay | Admitting: Bariatrics

## 2019-10-11 ENCOUNTER — Encounter (INDEPENDENT_AMBULATORY_CARE_PROVIDER_SITE_OTHER): Payer: Self-pay | Admitting: Bariatrics

## 2019-10-11 ENCOUNTER — Ambulatory Visit (INDEPENDENT_AMBULATORY_CARE_PROVIDER_SITE_OTHER): Payer: 59 | Admitting: Bariatrics

## 2019-10-11 ENCOUNTER — Other Ambulatory Visit: Payer: Self-pay

## 2019-10-11 VITALS — BP 114/77 | HR 125 | Temp 99.1°F | Ht 64.0 in | Wt 260.0 lb

## 2019-10-11 DIAGNOSIS — E559 Vitamin D deficiency, unspecified: Secondary | ICD-10-CM

## 2019-10-11 DIAGNOSIS — E119 Type 2 diabetes mellitus without complications: Secondary | ICD-10-CM

## 2019-10-11 DIAGNOSIS — F3289 Other specified depressive episodes: Secondary | ICD-10-CM

## 2019-10-11 DIAGNOSIS — E7849 Other hyperlipidemia: Secondary | ICD-10-CM | POA: Diagnosis not present

## 2019-10-11 DIAGNOSIS — Z6841 Body Mass Index (BMI) 40.0 and over, adult: Secondary | ICD-10-CM

## 2019-10-11 DIAGNOSIS — R632 Polyphagia: Secondary | ICD-10-CM | POA: Diagnosis not present

## 2019-10-11 DIAGNOSIS — Z9189 Other specified personal risk factors, not elsewhere classified: Secondary | ICD-10-CM | POA: Diagnosis not present

## 2019-10-11 MED ORDER — VITAMIN D3 1.25 MG (50000 UT) PO CAPS: 1.0000 | ORAL_CAPSULE | ORAL | 0 refills | Status: DC

## 2019-10-11 MED FILL — VIT D3-50 50,000 UNITS CAPS: 1.25 MG | 28 days supply | Qty: 4 | Fill #0

## 2019-10-11 NOTE — Progress Notes (Signed)
Chief Complaint:   OBESITY Rhonda Garza is here to discuss her progress with her obesity treatment plan along with follow-up of her obesity related diagnoses. Rhonda Garza is on the Category 3 Plan and states she is following her eating plan approximately 20% of the time. Rhonda Garza states she is doing yoga 60 minutes 5 times per week.  Today's visit was #: 2 Starting weight: 260 lbs Starting date: 09/27/2019 Today's weight: 260 lbs Today's date: 10/11/2019 Total lbs lost to date: 0 Total lbs lost since last in-office visit: 0  Interim History: Rhonda Garza's weight remains the same. She states that she was not able to start the plan until now due to losing her packet of information including her food plan.   Subjective:   Vitamin D deficiency. Rhonda Garza is taking Vitamin D 2,000 units OTC. Last Vitamin D level was 34.7 on 09/27/2019.  Type 2 diabetes mellitus without complication, without long-term current use of insulin ([place]). Her levels are not at goal. She is taking Victoza and Jardiance.  Lab Results  Component Value Date   HGBA1C 7.9 (H) 09/27/2019   Lab Results  Component Value Date   LDLCALC 82 09/27/2019   CREATININE 0.77 09/27/2019   Lab Results  Component Value Date   INSULIN 31.4 (H) 09/27/2019    Other hyperlipidemia. Shaynia is taking Zocor.    Lab Results  Component Value Date   CHOL 158 09/27/2019   HDL 48 09/27/2019   LDLCALC 82 09/27/2019   TRIG 163 (H) 09/27/2019   Lab Results  Component Value Date   ALT 31 09/27/2019   AST 20 09/27/2019   ALKPHOS 80 09/27/2019   BILITOT 0.2 09/27/2019   The 10-year ASCVD risk score [name], et al., 2013) is: 2.7%   Values used to calculate the score:     Age: 84 years     Sex: Female     Is Non-Hispanic African American: No     Diabetic: Yes     Tobacco smoker: No     Systolic Blood Pressure: 99991111 mmHg     Is BP treated: Yes     HDL Cholesterol: 48 mg/dL     Total Cholesterol: 158 mg/dL  Binge eating.  Rhonda Garza is taking Vyvanse 30 mg daily, which is helping with her binge eating.  Other depression, with emotional eating. Rhonda Garza is struggling with emotional eating and using food for comfort to the extent that it is negatively impacting her health. She has been working on behavior modification techniques to help reduce her emotional eating and has been somewhat successful. She shows no sign of suicidal or homicidal ideations.  At risk for osteoporosis. Rhonda Garza is at higher risk of osteopenia and osteoporosis due to Vitamin D deficiency.   Assessment/Plan:   Vitamin D deficiency. Low Vitamin D level contributes to fatigue and are associated with obesity, breast, and colon cancer. She agrees to take prescription Cholecalciferol (VITAMIN D3) 1.25 MG (50000 UT) CAPS every week #4 with 0 refills and will follow-up for routine testing of Vitamin D, at least 2-3 times per year to avoid over-replacement.   Type 2 diabetes mellitus without complication, without long-term current use of insulin ([place]). Good blood sugar control is important to decrease the likelihood of diabetic complications such as nephropathy, neuropathy, limb loss, blindness, coronary artery disease, and death. Intensive lifestyle modification including diet, exercise and weight loss are the first line of treatment for diabetes. Rhonda Garza will continue Victoza, decrease carbohydrates, increase healthy fats  and protein, and continue Jardiance.  Other hyperlipidemia. Cardiovascular risk and specific lipid/LDL goals reviewed.  We discussed several lifestyle modifications today and Rhonda Garza will continue to work on diet, exercise and weight loss efforts. Orders and follow up as documented in patient record. She will increase activity, increase healthy fats and protein, and decrease saturated fats.  Counseling Intensive lifestyle modifications are the first line treatment for this issue. . Dietary changes: Increase soluble fiber. Decrease simple  carbohydrates. . Exercise changes: Moderate to vigorous-intensity aerobic activity 150 minutes per week if tolerated. Lipid-lowering medications: see documented in medical record.  Binge eating. Kenedee will continue Vyvanse per her PCP.  Other depression, with emotional eating. Behavior modification techniques were discussed today to help Rhonda Garza deal with her emotional/non-hunger eating behaviors.  Orders and follow up as documented in patient record. [name] will be scheduled to see [name], our bariatric psychologist, for evaluation. We dicussed CBT techniques.  At risk for osteoporosis. Rhonda Garza was given approximately 15 minutes of osteoporosis prevention counseling today. Rhonda Garza is at risk for osteopenia and osteoporosis due to her Vitamin D deficiency. She was encouraged to take her Vitamin D and follow her higher calcium diet and increase strengthening exercise to help strengthen her bones and decrease her risk of osteopenia and osteoporosis.  Repetitive spaced learning was employed today to elicit superior memory formation and behavioral change.  Class 3 severe obesity with serious comorbidity and body mass index (BMI) of 40.0 to 44.9 in adult, unspecified obesity type ([place]).  Rhonda Garza is currently in the action stage of change. As such, her goal is to continue with weight loss efforts. She has agreed to the Category 3 Plan.   She will work on meal planning and intentional eating.  We reviewed CMP, lipids, Vitamin D, B12, and thyroid panel independently with the patient.  Surgical Seminar Information given for [name].  Exercise goals: For substantial health benefits, adults should do at least 150 minutes (2 hours and 30 minutes) a week of moderate-intensity, or 75 minutes (1 hour and 15 minutes) a week of vigorous-intensity aerobic physical activity, or an equivalent combination of moderate- and vigorous-intensity aerobic activity. Aerobic activity should be performed in  episodes of at least 10 minutes, and preferably, it should be spread throughout the week. Adults should also include muscle-strengthening activities that involve all major muscle groups on 2 or more days a week.  Behavioral modification strategies: increasing lean protein intake, decreasing simple carbohydrates, increasing vegetables, increasing water intake, decreasing eating out, no skipping meals, meal planning and cooking strategies and keeping healthy foods in the home.  Jamei has agreed to follow-up with our clinic in 2-3 weeks. She was informed of the importance of frequent follow-up visits to maximize her success with intensive lifestyle modifications for her multiple health conditions.   Objective:   Blood pressure 114/77, pulse (!) 125, temperature 99.1 F (37.3 C), height 5\' 4"  (1.626 m), weight 260 lb (117.9 kg), SpO2 95 %. Body mass index is 44.63 kg/m.  General: Cooperative, alert, well developed, in no acute distress. HEENT: Conjunctivae and lids unremarkable. Cardiovascular: Regular rhythm.  Lungs: Normal work of breathing. Neurologic: No focal deficits.   Lab Results  Component Value Date   CREATININE 0.77 09/27/2019   BUN 15 09/27/2019   NA 142 09/27/2019   K 3.9 09/27/2019   CL 97 09/27/2019   CO2 26 09/27/2019   Lab Results  Component Value Date   ALT 31 09/27/2019   AST 20 09/27/2019  ALKPHOS 80 09/27/2019   BILITOT 0.2 09/27/2019   Lab Results  Component Value Date   HGBA1C 7.9 (H) 09/27/2019   Lab Results  Component Value Date   INSULIN 31.4 (H) 09/27/2019   Lab Results  Component Value Date   TSH 2.460 09/27/2019   Lab Results  Component Value Date   CHOL 158 09/27/2019   HDL 48 09/27/2019   LDLCALC 82 09/27/2019   TRIG 163 (H) 09/27/2019   No results found for: WBC, HGB, HCT, MCV, PLT No results found for: IRON, TIBC, FERRITIN  Attestation Statements:   Reviewed by clinician on day of visit: allergies, medications, problem list,  medical history, surgical history, family history, social history, and previous encounter notes.  Migdalia Dk, am acting as Location manager for [employer], DO   I have reviewed the above documentation for accuracy and completeness, and I agree with the above. Jearld Lesch, DO

## 2019-10-11 NOTE — Progress Notes (Signed)
Office: [phone]  /  Fax: [phone]    Date: October 18, 2019   Appointment Start Time: 11:01am Duration: 50 minutes Provider: Glennie Isle, Psy.D. Type of Session: Intake for Individual Therapy  Location of Patient: Home Location of Provider: Provider's Home Type of Contact: Telepsychological Visit via [employer]  Informed Consent: Prior to proceeding with today's appointment, two pieces of identifying information were obtained. In addition, Ed's physical location at the time of this appointment was obtained as well a phone number she could be reached at in the event of technical difficulties. [name] and this provider participated in today's telepsychological service.   The provider's role was explained to Reyne Dumas. The provider reviewed and discussed issues of confidentiality, privacy, and limits therein (e.g., reporting obligations). In addition to verbal informed consent, written informed consent for psychological services was obtained prior to the initial appointment. Since the clinic is not a 24/7 crisis center, mental health emergency resources were shared and this  provider explained MyChart, e-mail, voicemail, and/or other messaging systems should be utilized only for non-emergency reasons. This provider also explained that information obtained during appointments will be placed in [facility] record and relevant information will be shared with other providers at [facility] for coordination of care. Moreover, Darnetta agreed information may be shared with other [facility] providers as needed for coordination of care. By signing the service agreement document, Jerlisa provided written consent for coordination of care. Prior to initiating telepsychological services, Azzie completed an informed consent document, which included the development of a safety plan (i.e., an emergency contact, nearest emergency room, and emergency resources) in  the event of an emergency/crisis. Ashli expressed understanding of the rationale of the safety plan. Karly verbally acknowledged understanding she is ultimately responsible for understanding her insurance benefits for telepsychological and in-person services. This provider also reviewed confidentiality, as it relates to telepsychological services, as well as the rationale for telepsychological services (i.e., to reduce exposure risk to COVID-19). [name]  acknowledged understanding that appointments cannot be recorded without both party consent and she is aware she is responsible for securing confidentiality on her end of the session. Janiesha verbally consented to proceed.  Chief Complaint/HPI: Kaymarie was referred by Dr. Jearld Lesch due to depression with emotional eating behaviors. Per the note for the visit with Dr. Jearld Lesch on October 11, 2019, "Tequia is struggling with emotional eating and using food for comfort to the extent that it is negatively impacting her health. She has been working on behavior modification techniques to help reduce her emotional eating and has been somewhat successful. She shows no sign of suicidal or homicidal ideations." During the initial appointment with Dr. Jearld Lesch at [facility] on September 27, 2019, Lataisha reported experiencing the following: snacking frequently in the evenings, frequently drinking liquids with calories, frequently making poor food choices, frequently eating larger portions than normal , binge eating behaviors, struggling with emotional eating, having problems with excessive hunger, cravings for sweets and bread and skipping breakfast frequently. Valoria's Food and Mood (modified PHQ-9) score on September 27, 2019 was 23.  During today's appointment, Zoye was verbally administered a questionnaire assessing various behaviors related to emotional eating. Rayme endorsed the following: overeat when you are celebrating, experience food cravings  on a regular basis, eat certain foods when you are anxious, stressed, depressed, or your feelings are hurt, use food to help you cope with emotional situations, find food is comforting to you, overeat when  you are angry or upset, overeat when you are worried about something, overeat frequently when you are bored or lonely, not worry about what you eat when you are in a good mood and overeat when you are angry at someone just to show them they cannot control you. She shared she craves salty foods (e.g., nuts, chips, bread) and soda. Roselynne reported she is unsure about the onset of emotional eating. She described the current frequency of emotional eating as "a few times in a day." In addition, Asherah endorsed a history of binge eating. Laquana reported she consumes large portions and stated she is unsure if she feels out of control when eating; however, believes it is likely mindless eating. She described the frequency of the aforementioned as "a couple times a week." She stated her PCP prescribed Vyvanse last year to assist with binge eating, noting she was not taking it regularly until the past month. She reported, "I do feel like it helps." Klee denied a history of restricting food intake, purging and engagement in other compensatory strategies, and she has never received any treatment for binge eating or emotional eating. Moreover, Florette indicated she is unsure what triggers emotional eating. Furthermore, Saima denied other problems of concern.    Mental Status Examination:  Appearance: well groomed and appropriate hygiene  Behavior: appropriate to circumstances Mood: euthymic Affect: mood congruent Speech: normal in rate, volume, and tone Eye Contact: appropriate Psychomotor Activity: appropriate Gait: unable to assess Thought Process: linear, logical, and goal directed  Thought Content/Perception: denies suicidal and homicidal ideation, plan, and intent and no hallucinations, delusions, bizarre  thinking or behavior reported or observed Orientation: time, person, place and purpose of appointment Memory/Concentration: memory, attention, language, and fund of knowledge intact  Insight/Judgment: good  Family & Psychosocial History: Reagyn reported she is married and has two adult children (ages 67 and 70). 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 Additionally, Brody shared her highest level of education obtained is a bachelor's degree. Currently, Wynette's social support system consists of her friends, twin sister, husband, aunts, and  cousins. Moreover, Lucca stated she resides with her husband and dog.   Medical History:  Past Medical History:  Diagnosis Date  . Anxiety   . Back pain   . Blurry vision   . Constipation   . Depression   . Diabetes mellitus without complication ([place])   . Edema, lower extremity   . High cholesterol   . Hypertension   . Obesity   . Obstructive sleep apnea hypopnea, mild 11/2015  . Shortness of breath    Past Surgical History:  Procedure Laterality Date  . BREAST BIOPSY Right 02/26/2007  . BREAST CYST EXCISION Right    over 10 yrs ago  . BREAST EXCISIONAL BIOPSY Right 1985  . CESAREAN SECTION     Current Outpatient Medications on File Prior to Visit  Medication Sig Dispense Refill  . ALPRAZolam (XANAX) 0.25 MG tablet Take by mouth.    . cetirizine (ZYRTEC) 10 MG tablet Take 10 mg by mouth as needed for allergies.    . Cholecalciferol (VITAMIN D3) 1.25 MG (50000 UT) CAPS Take 1 Dose by mouth once a week. 4 capsule 0  . escitalopram (LEXAPRO) 10 MG tablet TAKE 1 TABLET BY MOUTH DAILY    . fluticasone (FLONASE) 50 MCG/ACT nasal spray Place into both nostrils as needed for allergies or rhinitis.    Marland Kitchen liraglutide (VICTOZA) 18 MG/3ML SOPN Inject  0.6 mg into the skin.    Marland Kitchen lisdexamfetamine (VYVANSE) 30 MG capsule Take 30 mg by mouth daily.    . metFORMIN (GLUCOPHAGE) 500 MG tablet   2  .  simvastatin (ZOCOR) 40 MG tablet TAKE 1 TABLET BY MOUTH AT BEDTIME.    . valsartan-hydrochlorothiazide (DIOVAN-HCT) 160-25 MG tablet Take by mouth.     No current facility-administered medications on file prior to visit.  Sarah-Jane denied a history of head injuries and loss of consciousness.    Mental Health History: Amire reported she received therapeutic services via [facility] for grief in 2004 and 2009 after her parents passed away. Estalee reported there is no history of hospitalizations for psychiatric concerns, and has never met with a psychiatrist. Rylynne stated her PCP currently prescribes Xanax PRN and Lexapro, adding she took Xanax over a year ago. Ynez endorsed a family history of mental health related concerns. She stated her mother was diagnosed with bipolar disorder and believes her father suffered "bouts of depression." Rogena reported there is no history of trauma including psychological, physical  and sexual abuse, as well as neglect.   Manilla described her typical mood as "mostly, always happy;" however, described feeling "anxious." She added, "I worry a lot." Aside from concerns noted above and endorsed on the PHQ-9 and GAD-7, Lexington reported experiencing worry thoughts about work, pandemic, finances, and marital concerns; decreased motivation; social withdrawal due to weight; and decreased self-confidence due to weight. Chassie endorsed current alcohol use. She described consuming alcohol socially, noting she will have one standard beverage. She further added her mother was "an alcoholic." She denied current tobacco use, noting she quit smoking 21 years ago. She denied illicit/recreational substance use. Regarding caffeine intake, Sybol reported consuming 32 oz. sweet tea once or twice a week and 3-4 cans of soda daily. Furthermore, [name] indicated she is not experiencing the following: hopelessness, memory concerns, hallucinations and delusions, paranoia, symptoms of mania (e.g., expansive  mood, flighty ideas, decreased need for sleep, engagement in risky behaviors), crying spells and panic attacks. She also denied history of and current suicidal ideation, plan, and intent; history of and current homicidal ideation, plan, and intent; and history of and current engagement in self-harm.  The following strengths were reported by [name]: very positive, very loyal, very compassionate, and caring. The following strengths were observed by this provider: ability to express thoughts and feelings during the therapeutic session, ability to establish and benefit from a therapeutic relationship, willingness to work toward established goal(s) with the clinic and ability to engage in reciprocal conversation.  Legal History: Lene reported there is no history of legal involvement.   Structured Assessments Results: The Patient Health Questionnaire-9 (PHQ-9) is a self-report measure that assesses symptoms and severity of depression over the course of the last two weeks. Edison obtained a score of 10 suggesting moderate depression. Macala finds the endorsed symptoms to be somewhat difficult. [0= Not at all; 1= Several days; 2= More than half the days; 3= Nearly every day] Little interest or pleasure in doing things 1  Feeling down, depressed, or hopeless 1  Trouble falling or staying asleep, or sleeping too much 1  Feeling tired or having little energy 3  Poor appetite or overeating 3  Feeling bad about yourself --- or that you are a failure or have let yourself or your family down 1  Trouble concentrating on things, such as reading the newspaper or watching television 0  Moving or speaking so slowly that other people could have  noticed? Or the opposite --- being so fidgety or restless that you have been moving around a lot more than usual 0  Thoughts that you would be better off dead or hurting yourself in some way 0  PHQ-9 Score 10    The Generalized Anxiety Disorder-7 (GAD-7) is a brief  self-report measure that assesses symptoms of anxiety over the course of the last two weeks. Cheryl obtained a score of 16 suggesting severe anxiety. Britiny finds the endorsed symptoms to be somewhat difficult. [0= Not at all; 1= Several days; 2= Over half the days; 3= Nearly every day] Feeling nervous, anxious, on edge 3  Not being able to stop or control worrying 3  Worrying too much about different things 3  Trouble relaxing 3  Being so restless that it's hard to sit still 1  Becoming easily annoyed or irritable 0  Feeling afraid as if something awful might happen 3  GAD-7 Score 16   Interventions:  Conducted a chart review Focused on rapport building Verbally administered PHQ-9 and GAD-7 for symptom monitoring Verbally administered Food & Mood questionnaire to assess various behaviors related to emotional eating. Provided emphatic reflections and validation Collaborated with patient on a treatment goal  Psychoeducation provided regarding physical versus emotional hunger  Provisional DSM-5 Diagnoses: 300.02 (F41.1) Generalized Anxiety Disorder and 307.50 (F50.9) Unspecified Feeding or Eating Disorder  Plan: Gizel appears able and willing to participate as evidenced by collaboration on a treatment goal, engagement in reciprocal conversation, and asking questions as needed for clarification. The next appointment will be scheduled in two weeks, which will be via [employer]. The following treatment goal was established: decrease emotional eating. This provider will regularly review the treatment plan and medical chart to keep informed of status changes. Brandyce expressed understanding and agreement with the initial treatment plan of care. Cari will be sent a handout via e-mail to utilize between now and the next appointment to increase awareness of hunger patterns and subsequent eating. [name] provided verbal consent during today's appointment for this provider to send the handout via e-mail.

## 2019-10-12 ENCOUNTER — Encounter (INDEPENDENT_AMBULATORY_CARE_PROVIDER_SITE_OTHER): Payer: Self-pay | Admitting: Bariatrics

## 2019-10-18 ENCOUNTER — Ambulatory Visit (INDEPENDENT_AMBULATORY_CARE_PROVIDER_SITE_OTHER): Payer: 59 | Admitting: Psychology

## 2019-10-18 ENCOUNTER — Other Ambulatory Visit: Payer: Self-pay

## 2019-10-18 DIAGNOSIS — F509 Eating disorder, unspecified: Secondary | ICD-10-CM

## 2019-10-18 DIAGNOSIS — F411 Generalized anxiety disorder: Secondary | ICD-10-CM | POA: Diagnosis not present

## 2019-10-18 MED FILL — VALSARTAN-HCTZ 320-25 MG TA: 320-25 | 30 days supply | Qty: 30 | Fill #0

## 2019-10-19 ENCOUNTER — Other Ambulatory Visit: Payer: Self-pay | Admitting: Family Medicine

## 2019-10-19 DIAGNOSIS — Z1231 Encounter for screening mammogram for malignant neoplasm of breast: Secondary | ICD-10-CM

## 2019-10-19 NOTE — Progress Notes (Signed)
  Office: [phone]  /  Fax: [phone]    Date: November 02, 2019   Appointment Start Time: 2:00pm Duration: 32 minutes Provider: Glennie Isle, Psy.D. Type of Session: Individual Therapy  Location of Patient: Home Location of Provider: Provider's Home Type of Contact: Telepsychological Visit via [employer] Web[employer]  Session Content: Rhonda Garza is a 54 y.o. female presenting via [place] for a follow-up appointment to address the previously established treatment goal of decreasing emotional eating. Today's appointment was a telepsychological visit due to COVID-19. Rhonda Garza provided verbal consent for today's telepsychological appointment and she is aware she is responsible for securing confidentiality on her end of the session. Prior to proceeding with today's appointment, [facility] physical location at the time of this appointment was obtained as well a phone number she could be reached at in the event of technical difficulties. Rhonda Garza and this provider participated in today's telepsychological service.   This provider conducted a brief check-in. Rhonda Garza reported she continues to engage in yoga and meditation. She stated she is considering bariatric surgery, noting she discussed it further with her PCP. This provider discussed the option of attending a support group with [place]. 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 Emotional and physical hunger was reviewed. Psychoeducation regarding triggers for emotional eating was provided. Rhonda Garza was provided a handout, and encouraged to utilize the handout between now and the next appointment to increase awareness of triggers and frequency. Rhonda Garza agreed. This provider also discussed behavioral strategies for specific triggers, such as placing the utensil down when conversing to avoid mindless eating. Rhonda Garza provided verbal consent during today's appointment for this provider to send a handout about triggers via  e-mail. Overall, Rhonda Garza was receptive to today's appointment as evidenced by openness to sharing, responsiveness to feedback, and willingness to explore triggers for emotional eating.  Mental Status Examination:  Appearance: well groomed and appropriate hygiene  Behavior: appropriate to circumstances Mood: euthymic Affect: mood congruent Speech: normal in rate, volume, and tone Eye Contact: appropriate Psychomotor Activity: appropriate Gait: unable to assess Thought Process: linear, logical, and goal directed  Thought Content/Perception: no hallucinations, delusions, bizarre thinking or behavior reported or observed and no evidence of suicidal and homicidal ideation, plan, and intent Orientation: time, person, place and purpose of appointment Memory/Concentration: memory, attention, language, and fund of knowledge intact  Insight/Judgment: good  Interventions:  Conducted a brief chart review Provided empathic reflections and validation Reviewed content from the previous session Employed supportive psychotherapy interventions to facilitate reduced distress and to improve coping skills with identified stressors Employed motivational interviewing skills to assess patient's willingness/desire to adhere to recommended medical treatments and assignments Psychoeducation provided regarding triggers for emotional eating  DSM-5 Diagnoses: 300.02 (F41.1) Generalized Anxiety Disorder and 307.50 (F50.9) Unspecified Feeding or Eating Disorder  Treatment Goal & Progress: During the initial appointment with this provider, the following treatment goal was established: decrease emotional eating. Man has demonstrated progress in her goal as evidenced by increased awareness of hunger patterns.   Plan: The next appointment will be scheduled in two weeks, which will be via [employer]. The next session will focus on working towards the established treatment goal.

## 2019-10-26 MED FILL — ESCITALOPRAM 10 MG TABLET: 10 | 30 days supply | Qty: 45 | Fill #0

## 2019-10-26 MED FILL — JARDIANCE 25 MG TABLET: 25 | 30 days supply | Qty: 30 | Fill #0

## 2019-10-26 MED FILL — SIMVASTATIN 40 MG TABLET: 40 | 30 days supply | Qty: 30 | Fill #0

## 2019-10-26 MED FILL — VITAMIN D3 50,000 UNITS CAP: 1.25 MG | 28 days supply | Qty: 4 | Fill #0

## 2019-10-28 DIAGNOSIS — F5081 Binge eating disorder: Secondary | ICD-10-CM | POA: Diagnosis not present

## 2019-10-28 DIAGNOSIS — F411 Generalized anxiety disorder: Secondary | ICD-10-CM | POA: Diagnosis not present

## 2019-10-28 DIAGNOSIS — E782 Mixed hyperlipidemia: Secondary | ICD-10-CM | POA: Diagnosis not present

## 2019-10-28 DIAGNOSIS — Z6841 Body Mass Index (BMI) 40.0 and over, adult: Secondary | ICD-10-CM | POA: Diagnosis not present

## 2019-10-28 DIAGNOSIS — E119 Type 2 diabetes mellitus without complications: Secondary | ICD-10-CM | POA: Diagnosis not present

## 2019-10-28 DIAGNOSIS — I10 Essential (primary) hypertension: Secondary | ICD-10-CM | POA: Diagnosis not present

## 2019-10-29 MED FILL — TOPIRAMATE 25 MG TABLET: 25 | 49 days supply | Qty: 70 | Fill #0

## 2019-10-29 MED FILL — FARXIGA 10 MG TABLET: 10 | 30 days supply | Qty: 30 | Fill #0

## 2019-11-02 ENCOUNTER — Other Ambulatory Visit: Payer: Self-pay

## 2019-11-02 ENCOUNTER — Ambulatory Visit (INDEPENDENT_AMBULATORY_CARE_PROVIDER_SITE_OTHER): Payer: 59 | Admitting: Psychology

## 2019-11-02 DIAGNOSIS — F509 Eating disorder, unspecified: Secondary | ICD-10-CM | POA: Diagnosis not present

## 2019-11-02 DIAGNOSIS — F411 Generalized anxiety disorder: Secondary | ICD-10-CM

## 2019-11-03 ENCOUNTER — Other Ambulatory Visit (HOSPITAL_COMMUNITY): Payer: Self-pay | Admitting: Physician Assistant

## 2019-11-03 ENCOUNTER — Ambulatory Visit (INDEPENDENT_AMBULATORY_CARE_PROVIDER_SITE_OTHER): Payer: 59 | Admitting: Bariatrics

## 2019-11-03 DIAGNOSIS — E1165 Type 2 diabetes mellitus with hyperglycemia: Secondary | ICD-10-CM | POA: Diagnosis not present

## 2019-11-03 DIAGNOSIS — Z0000 Encounter for general adult medical examination without abnormal findings: Secondary | ICD-10-CM | POA: Diagnosis not present

## 2019-11-03 DIAGNOSIS — F5081 Binge eating disorder: Secondary | ICD-10-CM | POA: Diagnosis not present

## 2019-11-03 DIAGNOSIS — Z23 Encounter for immunization: Secondary | ICD-10-CM | POA: Diagnosis not present

## 2019-11-03 DIAGNOSIS — Z1211 Encounter for screening for malignant neoplasm of colon: Secondary | ICD-10-CM | POA: Diagnosis not present

## 2019-11-03 DIAGNOSIS — Z6841 Body Mass Index (BMI) 40.0 and over, adult: Secondary | ICD-10-CM | POA: Diagnosis not present

## 2019-11-03 NOTE — Progress Notes (Signed)
  Office: [phone]  /  Fax: [phone]    Date: November 17, 2019   Appointment Start Time: 4:00pm Duration: 33 minutes Provider: Glennie Isle, Psy.D. Type of Session: Individual Therapy  Location of Patient: Home Location of Provider: Provider's Home Type of Contact: Telepsychological Visit via [employer] Web[employer]  Session Content: Rhonda Garza is a 54 y.o. female presenting via [place] for a follow-up appointment to address the previously established treatment goal of decreasing emotional eating. Today's appointment was a telepsychological visit due to COVID-19. Rhonda Garza provided verbal consent for today's telepsychological appointment and she is aware she is responsible for securing confidentiality on her end of the session. Prior to proceeding with today's appointment, [facility] physical location at the time of this appointment was obtained as well a phone number she could be reached at in the event of technical difficulties. Rhonda Garza and this provider participated in today's telepsychological service.   This provider conducted a brief check-in. Rhonda Garza shared she lost weight and noted she is not drinking soda. Triggers for emotional eating were reviewed. Psychoeducation regarding mindfulness was provided. A handout was provided to Rhonda Garza with further information regarding mindfulness, including exercises. This provider also explained the benefit of mindfulness as it relates to emotional eating. Rhonda Garza was encouraged to engage in the provided exercises between now and the next appointment with this provider. Rhonda Garza agreed. During today's appointment, Rhonda Garza was led through a mindfulness exercise involving her senses. Rhonda Garza provided verbal consent during today's appointment for this provider to send a handout about mindfulness via e-mail. Rhonda Garza was receptive to today's appointment as evidenced by openness to sharing, responsiveness to feedback, and willingness to engage in mindfulness exercises to assist with  coping.  Mental Status Examination:  Appearance: well groomed and appropriate hygiene  Behavior: appropriate to circumstances Mood: euthymic Affect: mood congruent Speech: normal in rate, volume, and tone Eye Contact: appropriate Psychomotor Activity: appropriate Gait: unable to assess Thought Process: linear, logical, and goal directed  Thought Content/Perception: no hallucinations, delusions, bizarre thinking or behavior reported or observed and no evidence of suicidal and homicidal ideation, plan, and intent Orientation: time, person, place and purpose of appointment Memory/Concentration: memory, attention, language, and fund of knowledge intact  Insight/Judgment: good  Interventions:  Conducted a brief chart review Provided empathic reflections and validation Reviewed content from the previous session Employed supportive psychotherapy interventions to facilitate reduced distress and to improve coping skills with identified stressors Employed motivational interviewing skills to assess patient's willingness/desire to adhere to recommended medical treatments and assignments Psychoeducation provided regarding mindfulness Engaged patient in mindfulness exercise(s) Employed acceptance and commitment interventions to emphasize mindfulness and acceptance without struggle  DSM-5 Diagnoses: 300.02 (F41.1) Generalized Anxiety Disorder and 307.50 (F50.9) Unspecified Feeding or Eating Disorder  Treatment Goal & Progress: During the initial appointment with this provider, the following treatment goal was established: decrease emotional eating. Rhonda Garza has demonstrated progress in her goal as evidenced by increased awareness of hunger patterns and increased awareness of triggers for emotional eating. Rhonda Garza also demonstrates willingness to engage in mindfulness exercises.  Plan: Due to financial concerns, the next appointment will be scheduled in approximately three weeks, which will be via [employer]. The next session will focus on working towards the established treatment goal.

## 2019-11-04 ENCOUNTER — Ambulatory Visit (INDEPENDENT_AMBULATORY_CARE_PROVIDER_SITE_OTHER): Payer: 59 | Admitting: Bariatrics

## 2019-11-11 ENCOUNTER — Ambulatory Visit (INDEPENDENT_AMBULATORY_CARE_PROVIDER_SITE_OTHER): Payer: 59 | Admitting: Bariatrics

## 2019-11-17 ENCOUNTER — Ambulatory Visit (INDEPENDENT_AMBULATORY_CARE_PROVIDER_SITE_OTHER): Payer: 59 | Admitting: Psychology

## 2019-11-17 ENCOUNTER — Other Ambulatory Visit: Payer: Self-pay

## 2019-11-17 DIAGNOSIS — F509 Eating disorder, unspecified: Secondary | ICD-10-CM

## 2019-11-17 DIAGNOSIS — F411 Generalized anxiety disorder: Secondary | ICD-10-CM | POA: Diagnosis not present

## 2019-11-18 ENCOUNTER — Encounter (INDEPENDENT_AMBULATORY_CARE_PROVIDER_SITE_OTHER): Payer: Self-pay | Admitting: Bariatrics

## 2019-11-18 ENCOUNTER — Ambulatory Visit (INDEPENDENT_AMBULATORY_CARE_PROVIDER_SITE_OTHER): Payer: 59 | Admitting: Bariatrics

## 2019-11-18 ENCOUNTER — Other Ambulatory Visit: Payer: Self-pay

## 2019-11-18 VITALS — BP 110/74 | HR 104 | Temp 98.2°F | Ht 64.0 in | Wt 248.0 lb

## 2019-11-18 DIAGNOSIS — Z9189 Other specified personal risk factors, not elsewhere classified: Secondary | ICD-10-CM

## 2019-11-18 DIAGNOSIS — R632 Polyphagia: Secondary | ICD-10-CM

## 2019-11-18 DIAGNOSIS — E559 Vitamin D deficiency, unspecified: Secondary | ICD-10-CM | POA: Diagnosis not present

## 2019-11-18 DIAGNOSIS — Z6841 Body Mass Index (BMI) 40.0 and over, adult: Secondary | ICD-10-CM | POA: Diagnosis not present

## 2019-11-18 DIAGNOSIS — E119 Type 2 diabetes mellitus without complications: Secondary | ICD-10-CM | POA: Diagnosis not present

## 2019-11-18 MED ORDER — VITAMIN D3 1.25 MG (50000 UT) PO CAPS: 1.0000 | ORAL_CAPSULE | ORAL | 0 refills | Status: DC

## 2019-11-18 MED FILL — VIT D3-50 50,000 UNITS CAPS: 1.25 MG | 28 days supply | Qty: 4 | Fill #0

## 2019-11-18 MED FILL — VALSARTAN-HCTZ 320-25 MG TA: 320-25 | 90 days supply | Qty: 90 | Fill #0

## 2019-11-20 MED FILL — SIMVASTATIN 40 MG TABLET: 40 | 90 days supply | Qty: 90 | Fill #0

## 2019-11-22 ENCOUNTER — Encounter (INDEPENDENT_AMBULATORY_CARE_PROVIDER_SITE_OTHER): Payer: Self-pay | Admitting: Bariatrics

## 2019-11-22 MED FILL — ESCITALOPRAM 10 MG TABLET: 10 | 90 days supply | Qty: 135 | Fill #0

## 2019-11-22 MED FILL — VITAMIN D3 50,000 UNITS CAP: 1.25 MG | 28 days supply | Qty: 4 | Fill #0

## 2019-11-22 NOTE — Progress Notes (Signed)
Chief Complaint:   OBESITY Rhonda Garza is here to discuss her progress with her obesity treatment plan along with follow-up of her obesity related diagnoses. Rhonda Garza is on the Category 3 Plan and states she is following her eating plan approximately 80% of the time. Rhonda Garza states she is walking 30 minutes 2 times per week.  Today's visit was #: 3 Starting weight: 260 lbs Starting date: 09/27/2019 Today's weight: 248 lbs Today's date: 11/18/2019 Total lbs lost to date: 12 Total lbs lost since last in-office visit: 12  Interim History: Rhonda Garza is down 12 lbs. She has been more adherent to the plan.  Subjective:   Type 2 diabetes mellitus without complication, without long-term current use of insulin (Rhonda Garza). Rhonda Garza is taking metformin and Victoza.  Lab Results  Component Value Date   HGBA1C 7.9 (H) 09/27/2019   Lab Results  Component Value Date   LDLCALC 82 09/27/2019   CREATININE 0.77 09/27/2019   Lab Results  Component Value Date   INSULIN 31.4 (H) 09/27/2019   Vitamin D deficiency. No nausea, vomiting, or muscle weakness. Last Vitamin D 34.7 on 09/27/2019.  Binge eating. Rhonda Garza has seen [name]. She is taking Vyvanse.  At risk for hyperglycemia. Rhonda Garza is at risk for hyperglycemia due to diabetes mellitus II.  Assessment/Plan:   Type 2 diabetes mellitus without complication, without long-term current use of insulin (Rhonda Garza). Good blood sugar control is important to decrease the likelihood of diabetic complications such as nephropathy, neuropathy, limb loss, blindness, coronary artery disease, and death. Intensive lifestyle modification including diet, exercise and weight loss are the first line of treatment for diabetes. Rhonda Garza will continue her medications as directed.  Vitamin D deficiency. Low Vitamin D level contributes to fatigue and are associated with obesity, breast, and colon cancer. She was given a prescription for Cholecalciferol (VITAMIN D3) 1.25 MG (50000  UT) CAPS every week #4 with 0 refills and will follow-up for routine testing of Vitamin D, at least 2-3 times per year to avoid over-replacement.    Binge eating. We reviewed CBT techniques. She will continue Vyvanse as directed.  At risk for hyperglycemia. Rhonda Garza was given approximately 15 minutes of counseling today regarding prevention of hyperglycemia. She was advised of hyperglycemia causes and the fact hyperglycemia is often asymptomatic. Rhonda Garza was instructed to avoid skipping meals, eat regular protein rich meals and schedule low calorie but protein rich snacks as needed.   Repetitive spaced learning was employed today to elicit superior memory formation and behavioral change.  Class 3 severe obesity with serious comorbidity and body mass index (BMI) of 40.0 to 44.9 in adult, unspecified obesity type (HCC)  Rhonda Garza is currently in the action stage of change. As such, her goal is to continue with weight loss efforts. She has agreed to the Category 3 Plan.   She will work on meal planning and intentional eating.  She was given handout on "Eating Out."  Exercise goals: All adults should avoid inactivity. Some physical activity is better than none, and adults who participate in any amount of physical activity gain some health benefits.  Behavioral modification strategies: increasing lean protein intake, decreasing simple carbohydrates, increasing vegetables, increasing water intake, decreasing eating out, no skipping meals, meal planning and cooking strategies, keeping healthy foods in the home and planning for success.  Rhonda Garza has agreed to follow-up with our clinic in 2 weeks. She was informed of the importance of frequent follow-up visits to maximize her success with intensive lifestyle modifications  for her multiple health conditions.   Objective:   Blood pressure 110/74, pulse (!) 104, temperature 98.2 F (36.8 C), height 5\' 4"  (1.626 m), weight 248 lb (112.5 kg), SpO2 96 %. Body  mass index is 42.57 kg/m.  General: Cooperative, alert, well developed, in no acute distress. HEENT: Conjunctivae and lids unremarkable. Cardiovascular: Regular rhythm.  Lungs: Normal work of breathing. Neurologic: No focal deficits.   Lab Results  Component Value Date   CREATININE 0.77 09/27/2019   BUN 15 09/27/2019   NA 142 09/27/2019   K 3.9 09/27/2019   CL 97 09/27/2019   CO2 26 09/27/2019   Lab Results  Component Value Date   ALT 31 09/27/2019   AST 20 09/27/2019   ALKPHOS 80 09/27/2019   BILITOT 0.2 09/27/2019   Lab Results  Component Value Date   HGBA1C 7.9 (H) 09/27/2019   Lab Results  Component Value Date   INSULIN 31.4 (H) 09/27/2019   Lab Results  Component Value Date   TSH 2.460 09/27/2019   Lab Results  Component Value Date   CHOL 158 09/27/2019   HDL 48 09/27/2019   LDLCALC 82 09/27/2019   TRIG 163 (H) 09/27/2019   No results found for: WBC, HGB, HCT, MCV, PLT No results found for: IRON, TIBC, FERRITIN  Attestation Statements:   Reviewed by clinician on day of visit: allergies, medications, problem list, medical history, surgical history, family history, social history, and previous encounter notes.  Migdalia Dk, am acting as Location manager for [employer], DO   I have reviewed the above documentation for accuracy and completeness, and I agree with the above. Jearld Lesch, DO

## 2019-11-25 NOTE — Progress Notes (Unsigned)
  Office: [phone]  /  Fax: [phone]    Date: December 09, 2019   Appointment Start Time: *** Duration: *** minutes Provider: Glennie Isle, Psy.D. Type of Session: Individual Therapy  Location of Patient: {gbptloc:23249} Location of Provider: Provider's Home Type of Contact: Telepsychological Visit via {gbtelepsych:23399}  Session Content:This provider called Rhonda Garza at 4:02pm as she did not present for the WebEx appointment. A HIPAA compliant voicemail was left requesting a call back. *** As such, today's appointment was initiated *** minutes late.  [name] is a 54 y.o. female presenting via {gbtelepsych:23399} for a follow-up appointment to address the previously established treatment goal of decreasing emotional eating. Today's appointment was a telepsychological visit due to COVID-19. Rhonda Garza provided verbal consent for today's telepsychological appointment and she is aware she is responsible for securing confidentiality on her end of the session. Prior to proceeding with today's appointment, [name] physical location at the time of this appointment was obtained as well a phone number she could be reached at in the event of technical difficulties. Rhonda Garza and this provider participated in today's telepsychological service.   This provider conducted a brief check-in. ***Session focused further on mindfulness to assist with coping. *** [name] was led through a mindfulness exercise (***) and her experience was processed. Rhonda Garza provided verbal consent during today's appointment for this provider to send the handout for today's exercise via e-mail. [name] was receptive to today's appointment as evidenced by openness to sharing, responsiveness to feedback, and {gbreceptiveness:23401}.  Mental Status Examination:  Appearance: {Appearance:22431} Behavior: {Behavior:22445} Mood: {gbmood:21757} Affect: {Affect:22436} Speech: {Speech:22432} Eye Contact: {Eye Contact:22433} Psychomotor Activity:  {Motor Activity:22434} Gait: {gbgait:23404} Thought Process: {thought process:22448}  Thought Content/Perception: {disturbances:22451} Orientation: {Orientation:22437} Memory/Concentration: {gbcognition:22449} Insight/Judgment: {Insight:22446}  Interventions:  {Interventions for Progress Notes:23405}  DSM-5 Diagnosis(es): 300.02 (F41.1) Generalized Anxiety Disorder and 307.50 (F50.9) Unspecified Feeding or Eating Disorder  Treatment Goal & Progress: During the initial appointment with this provider, the following treatment goal was established: decrease emotional eating. [name] has demonstrated progress in her goal as evidenced by {gbtxprogress:22839}. [name] also {gbtxprogress2:22951}.  Plan: The next appointment will be scheduled in {gbweeks:21758}, which will be {gbtxmodality:23402}. The next session will focus on {Plan for Next Appointment:23400}.

## 2019-12-02 ENCOUNTER — Ambulatory Visit: Payer: Self-pay

## 2019-12-07 MED FILL — FARXIGA 10 MG TABLET: 10 | 30 days supply | Qty: 30 | Fill #1

## 2019-12-07 MED FILL — METFORMIN HCL 1000 MG TABS: 1000 | 90 days supply | Qty: 180 | Fill #0

## 2019-12-09 ENCOUNTER — Telehealth (INDEPENDENT_AMBULATORY_CARE_PROVIDER_SITE_OTHER): Payer: Self-pay | Admitting: Psychology

## 2019-12-09 ENCOUNTER — Ambulatory Visit (INDEPENDENT_AMBULATORY_CARE_PROVIDER_SITE_OTHER): Payer: Self-pay | Admitting: Psychology

## 2019-12-09 NOTE — Telephone Encounter (Signed)
  Office: [phone]  /  Fax: [phone]  Date of Call: December 09, 2019  Time of Call: 4:14pm Duration of Call: ~ 5 minutes Provider: Glennie Isle, PsyD  CONTENT: [name] e-mailed this provider regarding today's appointment at 4:13pm. The e-mail noted the following:  "Hello there,  I'm so sorry---I got on our call late. Can you still meet with me for the remaining 15 minutes? I guess a new webex will have to be sent or can we go into the other one that was scheduled for 4:00? I called the Wellness dept to let them know and am still on hold after nearly 5 minutes so I decided to email you directly. I don't have camera access today."    As such, this provider called [name] back and explained the appointment would have to be rescheduled. She was receptive and expressed understanding that the one time no show fee waiver will be applied. In addition, this provider thanked [name] for a pen she left at the clinic for this provider. No evidence of suicidal and homicidal ideation, plan, or intent  PLAN: Mireily is scheduled for an appointment on December 15, 2019 at 4:30pm via Webex.

## 2019-12-09 NOTE — Telephone Encounter (Signed)
  Office: [phone]  /  Fax: [phone]  Date of Call: December 09, 2019  Time of Call: 4:02pm Provider: Glennie Isle, PsyD  CONTENT: This provider called [name] to check-in as she did not present for today's Webex appointment at 4:00pm. A HIPAA compliant voicemail was left requesting a call back. Of note, this provider stayed on the WebEx appointment for 5 minutes prior to signing off per the clinic's grace period policy.    PLAN: This provider will wait for K[place]al to call back. No further follow-up planned by this provider.

## 2019-12-09 NOTE — Progress Notes (Signed)
  Office: [phone]  /  Fax: [phone]    Date: December 15, 2019   Appointment Start Time: 4:31pm Duration: 29 minutes Provider: Glennie Isle, Psy.D. Type of Session: Individual Therapy  Location of Patient: Home Location of Provider: Provider's Home Type of Contact: Telepsychological Visit via [employer] WebEx  Session Content: Rhonda Garza is a 54 y.o. female presenting via [facility] for a follow-up appointment to address the previously established treatment goal of decreasing emotional eating. Today's appointment was a telepsychological visit due to COVID-19. Rhonda Garza provided verbal consent for today's telepsychological appointment and she is aware she is responsible for securing confidentiality on her end of the session. Prior to proceeding with today's appointment, Rhonda Garza physical location at the time of this appointment was obtained as well a phone number she could be reached at in the event of technical difficulties. Rhonda Garza and this provider participated in today's telepsychological service.   This provider conducted a brief check-in. Rhonda Garza shared about recent weight loss, noting she continues to follow information provided. She also stopped drinking her "favorite soda." Session focused further on mindfulness to assist with coping. She reported feeling as though she has to "put in effort" to engage in mindfulness. This was explored further and normalized. Psychoeducation regarding formal (e.g., setting aside a specific time daily to engage in an exercise) and informal (e.g., cultivating awareness in the present moment and taking a non-judgmental approach while engaging in day-to-day tasks) mindfulness was provided. This provider also discussed the utilization of YouTube for mindfulness exercises (e.g., exercises by Merri Ray). Moreover, Rhonda Garza was led through a mindfulness exercise (A Taste of Mindfulness) and her experience was processed. Rhonda Garza provided verbal consent during today's  appointment for this provider to send the handout for today's exercise via e-mail. Rhonda Garza was receptive to today's appointment as evidenced by openness to sharing, responsiveness to feedback, and willingness to continue engaging in mindfulness exercises.  Mental Status Examination:  Appearance: well groomed and appropriate hygiene  Behavior: appropriate to circumstances Mood: euthymic Affect: mood congruent Speech: normal in rate, volume, and tone Eye Contact: appropriate Psychomotor Activity: appropriate Gait: unable to assess Thought Process: linear, logical, and goal directed  Thought Content/Perception: no hallucinations, delusions, bizarre thinking or behavior reported or observed and no evidence of suicidal and homicidal ideation, plan, and intent Orientation: time, person, place and purpose of appointment Memory/Concentration: memory, attention, language, and fund of knowledge intact  Insight/Judgment: good  Interventions:  Conducted a brief chart review Provided empathic reflections and validation Reviewed content from the previous session Employed supportive psychotherapy interventions to facilitate reduced distress and to improve coping skills with identified stressors Employed motivational interviewing skills to assess patient's willingness/desire to adhere to recommended medical treatments and assignments Psychoeducation provided regarding mindfulness Engaged patient in mindfulness exercise(s) Employed acceptance and commitment interventions to emphasize mindfulness and acceptance without struggle  DSM-5 Diagnosis(es): 300.02 (F41.1) Generalized Anxiety Disorder and 307.50 (F50.9) Unspecified Feeding or Eating Disorder  Treatment Goal & Progress: During the initial appointment with this provider, the following treatment goal was established: decrease emotional eating. Rhonda Garza has demonstrated progress in her goal as evidenced by increased awareness of hunger patterns and  increased awareness of triggers for emotional eating. Rhonda Garza also continues to demonstrate willingness to engage in learned skill(s).  Plan: Per [facility] request, the next appointment will be scheduled in approximately one month, which will be via MyChart Video Visit. The next session will focus on working towards the established treatment goal and termination planning.

## 2019-12-15 ENCOUNTER — Other Ambulatory Visit: Payer: Self-pay

## 2019-12-15 ENCOUNTER — Ambulatory Visit (INDEPENDENT_AMBULATORY_CARE_PROVIDER_SITE_OTHER): Payer: 59 | Admitting: Psychology

## 2019-12-15 DIAGNOSIS — F509 Eating disorder, unspecified: Secondary | ICD-10-CM

## 2019-12-15 DIAGNOSIS — F411 Generalized anxiety disorder: Secondary | ICD-10-CM

## 2019-12-30 ENCOUNTER — Ambulatory Visit (INDEPENDENT_AMBULATORY_CARE_PROVIDER_SITE_OTHER): Payer: 59 | Admitting: Bariatrics

## 2019-12-30 ENCOUNTER — Encounter (INDEPENDENT_AMBULATORY_CARE_PROVIDER_SITE_OTHER): Payer: Self-pay | Admitting: Bariatrics

## 2019-12-30 ENCOUNTER — Other Ambulatory Visit: Payer: Self-pay

## 2019-12-30 VITALS — BP 99/68 | HR 104 | Temp 98.8°F | Ht 64.0 in | Wt 242.0 lb

## 2019-12-30 DIAGNOSIS — E119 Type 2 diabetes mellitus without complications: Secondary | ICD-10-CM | POA: Diagnosis not present

## 2019-12-30 DIAGNOSIS — E559 Vitamin D deficiency, unspecified: Secondary | ICD-10-CM

## 2019-12-30 DIAGNOSIS — Z9189 Other specified personal risk factors, not elsewhere classified: Secondary | ICD-10-CM | POA: Diagnosis not present

## 2019-12-30 DIAGNOSIS — Z6841 Body Mass Index (BMI) 40.0 and over, adult: Secondary | ICD-10-CM | POA: Diagnosis not present

## 2019-12-30 MED ORDER — VITAMIN D3 1.25 MG (50000 UT) PO CAPS: 1.0000 | ORAL_CAPSULE | ORAL | 0 refills | Status: DC

## 2019-12-30 MED FILL — VIT D3-50 50,000 UNITS CAPS: 1.25 MG | 28 days supply | Qty: 4 | Fill #0

## 2020-01-03 MED FILL — VITAMIN D3 50,000 UNITS CAP: 1.25 MG | 28 days supply | Qty: 4 | Fill #0

## 2020-01-03 MED FILL — OZEMPIC 1 MG/DOSE SOPN: 2 | 84 days supply | Qty: 9 | Fill #0

## 2020-01-03 NOTE — Progress Notes (Signed)
Chief Complaint:   OBESITY Rhonda Garza is here to discuss her progress with her obesity treatment plan along with follow-up of her obesity related diagnoses. Rhonda Garza is on the Category 3 Plan and states she is following her eating plan approximately 60% of the time. Rhonda Garza states she is walking 60 minutes 4 times per week.  Today's visit was #: 4 Starting weight: 260 lbs Starting date: 09/27/2019 Today's weight: 242 lbs Today's date: 12/30/2019 Total lbs lost to date: 18 Total lbs lost since last in-office visit: 6  Interim History: Rhonda Garza is down 6 lbs today. She has made an appointment with Dr. Hassell Done secondary to gastric sleeve bariatric surgery, has gone to the support group, and has seen [name]. She wants to be able to come off of her medications. She notes numbness in her right arm around the elbow. She has been trying to meal plan better and has been increasing her exercise.   Subjective:   Vitamin D deficiency. [name] is on prescription Vitamin D. No nausea, vomiting, or muscle weakness. Last Vitamin D 34.7 on 09/27/2019.  Type 2 diabetes mellitus without complication, without long-term current use of insulin ([place]). Rhonda Garza is taking Victoza and metformin.    Lab Results  Component Value Date   HGBA1C 7.9 (H) 09/27/2019   Lab Results  Component Value Date   LDLCALC 82 09/27/2019   CREATININE 0.77 09/27/2019   Lab Results  Component Value Date   INSULIN 31.4 (H) 09/27/2019   At risk for osteoporosis. Rhonda Garza is at higher risk of osteopenia and osteoporosis due to Vitamin D deficiency.   Assessment/Plan:   Vitamin D deficiency. Low Vitamin D level contributes to fatigue and are associated with obesity, breast, and colon cancer. She was given a refill on her Cholecalciferol (VITAMIN D3) 1.25 MG (50000 UT) CAPS every week #4 with 0 refills and will follow-up for routine testing of Vitamin D at her next visit.   Type 2 diabetes mellitus without complication,  without long-term current use of insulin ([place]). Good blood sugar control is important to decrease the likelihood of diabetic complications such as nephropathy, neuropathy, limb loss, blindness, coronary artery disease, and death. Intensive lifestyle modification including diet, exercise and weight loss are the first line of treatment for diabetes. Lauree will continue her medications as directed. Will have labs rechecked at her next visit.  At risk for osteoporosis. Rhonda Garza was given approximately 15 minutes of osteoporosis prevention counseling today. Rhonda Garza is at risk for osteopenia and osteoporosis due to her Vitamin D deficiency. She was encouraged to take her Vitamin D and follow her higher calcium diet and increase strengthening exercise to help strengthen her bones and decrease her risk of osteopenia and osteoporosis.  Repetitive spaced learning was employed today to elicit superior memory formation and behavioral change.  Class 3 severe obesity with serious comorbidity and body mass index (BMI) of 40.0 to 44.9 in adult, unspecified obesity type ([place]).  Rhonda Garza is currently in the action stage of change. As such, her goal is to continue with weight loss efforts. She has agreed to the Category 3 Plan.   She will work on meal planning, will commit to meal prepping, and will continue increasing her exercise.  Exercise goals: Rhonda Garza will continue her current exercise regimen.  Behavioral modification strategies: increasing lean protein intake, decreasing simple carbohydrates, increasing vegetables, increasing water intake and meal planning and cooking strategies.  Rhonda Garza has agreed to follow-up with our clinic in 4 weeks. She  was informed of the importance of frequent follow-up visits to maximize her success with intensive lifestyle modifications for her multiple health conditions.   Objective:   Blood pressure 99/68, pulse (!) 104, temperature 98.8 F (37.1 C), height 5\' 4"  (1.626 m), weight  242 lb (109.8 kg), SpO2 96 %. Body mass index is 41.54 kg/m.  General: Cooperative, alert, well developed, in no acute distress. HEENT: Conjunctivae and lids unremarkable. Cardiovascular: Regular rhythm.  Lungs: Normal work of breathing. Neurologic: No focal deficits.   Lab Results  Component Value Date   CREATININE 0.77 09/27/2019   BUN 15 09/27/2019   NA 142 09/27/2019   K 3.9 09/27/2019   CL 97 09/27/2019   CO2 26 09/27/2019   Lab Results  Component Value Date   ALT 31 09/27/2019   AST 20 09/27/2019   ALKPHOS 80 09/27/2019   BILITOT 0.2 09/27/2019   Lab Results  Component Value Date   HGBA1C 7.9 (H) 09/27/2019   Lab Results  Component Value Date   INSULIN 31.4 (H) 09/27/2019   Lab Results  Component Value Date   TSH 2.460 09/27/2019   Lab Results  Component Value Date   CHOL 158 09/27/2019   HDL 48 09/27/2019   LDLCALC 82 09/27/2019   TRIG 163 (H) 09/27/2019   No results found for: WBC, HGB, HCT, MCV, PLT No results found for: IRON, TIBC, FERRITIN  Attestation Statements:   Reviewed by clinician on day of visit: allergies, medications, problem list, medical history, surgical history, family history, social history, and previous encounter notes.  Migdalia Dk, am acting as Location manager for [employer], DO   I have reviewed the above documentation for accuracy and completeness, and I agree with the above. Jearld Lesch, DO

## 2020-01-03 NOTE — Progress Notes (Signed)
Office: [phone]  /  Fax: [phone]    Date: January 13, 2020   Appointment Start Time: 1:59pm Duration: 30 minutes Provider: Glennie Isle, Psy.D. Type of Session: Individual Therapy  Location of Patient: Work Location of Provider: Provider's Home Type of Contact: Telepsychological Visit via MyChart Video Visit  Session Content: Rhonda Garza is a 54 y.o. female presenting via [place] Visit for a follow-up appointment to address the previously established treatment goal of decreasing emotional eating. Today's appointment was a telepsychological visit due to COVID-19. Rhonda Garza provided verbal consent for today's telepsychological appointment and she is aware she is responsible for securing confidentiality on her end of the session. Prior to proceeding with today's appointment, [facility] physical location at the time of this appointment was obtained as well a phone number she could be reached at in the event of technical difficulties. Rhonda Garza and this provider participated in today's telepsychological service.   Rhonda Garza acknowledged understanding that for today's appointment and future telepsychological appointments, MyChart Video Visit will be utilized. She also verbally acknowledged understanding that the information outlined in the telepsychological informed consent form signed at the onset of treatment would still be applicable despite the change in the videoconferencing platform.   This provider conducted a brief check-in. Rhonda Garza reported she lost 20 pounds and her A1C has improved. She noted a plan to pursue bariatric surgery, adding her first meeting with [facility] is next Friday. She described feeling "anxious" about the surgery. Thus, associated feelings and thoughts were explored and processed. Rhonda Garza was receptive to attending a support group and continuing longer-term therapeutic services if needed. Furthermore, termination planning was discussed. It was recommended at least one follow-up  appointment be scheduled with this provider following [facility] appointment with [facility]. Rhonda Garza expressed understanding, but requested to reach out via MyChart to schedule a follow-up if she feels it is needed. Overall, Rhonda Garza was receptive to today's appointment as evidenced by openness to sharing, responsiveness to feedback, and willingness to continue engaging in learned skills.  Mental Status Examination:  Appearance: well groomed and appropriate hygiene  Behavior: appropriate to circumstances Mood: euthymic Affect: mood congruent Speech: normal in rate, volume, and tone Eye Contact: appropriate Psychomotor Activity: appropriate Gait: unable to assess Thought Process: linear, logical, and goal directed  Thought Content/Perception: no hallucinations, delusions, bizarre thinking or behavior reported or observed and no evidence of suicidal and homicidal ideation, plan, and intent Orientation: time, person, place and purpose of appointment Memory/Concentration: memory, attention, language, and fund of knowledge intact  Insight/Judgment: good  Interventions:  Conducted a brief chart review Provided empathic reflections and validation Employed supportive psychotherapy interventions to facilitate reduced distress and to improve coping skills with identified stressors Employed motivational interviewing skills to assess patient's willingness/desire to adhere to recommended medical treatments and assignments Discussed termination planning  DSM-5 Diagnosis(es): 300.02 (F41.1) Generalized Anxiety Disorder and 307.50 (F50.9) Unspecified Feeding or Eating Disorder  Treatment Goal & Progress: During the initial appointment with this provider, the following treatment goal was established: decrease emotional eating. Rhonda Garza has demonstrated progress in her goal as evidenced by increased awareness of hunger patterns, increased awareness of triggers for emotional eating and reduction in emotional eating.  Rhonda Garza also continues to demonstrate willingness to engage in learned skill(s).  Plan: Rhonda Garza declined future appointments with this provider at this time due to upcoming appointments associated with bariatric surgery. She acknowledged understanding that she may request a follow-up appointment with this provider in the future as long as she is still established with the clinic.  No further follow-up planned by this provider.

## 2020-01-04 ENCOUNTER — Encounter (INDEPENDENT_AMBULATORY_CARE_PROVIDER_SITE_OTHER): Payer: Self-pay | Admitting: Bariatrics

## 2020-01-05 DIAGNOSIS — I10 Essential (primary) hypertension: Secondary | ICD-10-CM | POA: Diagnosis not present

## 2020-01-05 DIAGNOSIS — E1159 Type 2 diabetes mellitus with other circulatory complications: Secondary | ICD-10-CM | POA: Diagnosis not present

## 2020-01-05 DIAGNOSIS — E785 Hyperlipidemia, unspecified: Secondary | ICD-10-CM | POA: Diagnosis not present

## 2020-01-05 DIAGNOSIS — E1165 Type 2 diabetes mellitus with hyperglycemia: Secondary | ICD-10-CM | POA: Diagnosis not present

## 2020-01-05 DIAGNOSIS — E1169 Type 2 diabetes mellitus with other specified complication: Secondary | ICD-10-CM | POA: Diagnosis not present

## 2020-01-05 MED FILL — TOPIRAMATE 50 MG TABLET: 50 | 90 days supply | Qty: 135 | Fill #0

## 2020-01-13 ENCOUNTER — Telehealth (INDEPENDENT_AMBULATORY_CARE_PROVIDER_SITE_OTHER): Payer: 59 | Admitting: Psychology

## 2020-01-13 ENCOUNTER — Other Ambulatory Visit: Payer: Self-pay

## 2020-01-13 DIAGNOSIS — F411 Generalized anxiety disorder: Secondary | ICD-10-CM | POA: Diagnosis not present

## 2020-01-13 DIAGNOSIS — F509 Eating disorder, unspecified: Secondary | ICD-10-CM | POA: Diagnosis not present

## 2020-01-17 DIAGNOSIS — D2261 Melanocytic nevi of right upper limb, including shoulder: Secondary | ICD-10-CM | POA: Diagnosis not present

## 2020-01-17 DIAGNOSIS — D2262 Melanocytic nevi of left upper limb, including shoulder: Secondary | ICD-10-CM | POA: Diagnosis not present

## 2020-01-17 DIAGNOSIS — D2371 Other benign neoplasm of skin of right lower limb, including hip: Secondary | ICD-10-CM | POA: Diagnosis not present

## 2020-01-17 DIAGNOSIS — D1801 Hemangioma of skin and subcutaneous tissue: Secondary | ICD-10-CM | POA: Diagnosis not present

## 2020-01-17 DIAGNOSIS — D225 Melanocytic nevi of trunk: Secondary | ICD-10-CM | POA: Diagnosis not present

## 2020-01-17 DIAGNOSIS — L821 Other seborrheic keratosis: Secondary | ICD-10-CM | POA: Diagnosis not present

## 2020-01-17 DIAGNOSIS — L720 Epidermal cyst: Secondary | ICD-10-CM | POA: Diagnosis not present

## 2020-01-17 DIAGNOSIS — D2272 Melanocytic nevi of left lower limb, including hip: Secondary | ICD-10-CM | POA: Diagnosis not present

## 2020-01-17 DIAGNOSIS — Z85828 Personal history of other malignant neoplasm of skin: Secondary | ICD-10-CM | POA: Diagnosis not present

## 2020-01-20 ENCOUNTER — Other Ambulatory Visit: Payer: Self-pay

## 2020-01-20 ENCOUNTER — Ambulatory Visit
Admission: RE | Admit: 2020-01-20 | Discharge: 2020-01-20 | Disposition: A | Discharge status: Home / Self Care | Payer: 59 | Source: Ambulatory Visit | Attending: Family Medicine | Admitting: Family Medicine

## 2020-01-20 DIAGNOSIS — Z1231 Encounter for screening mammogram for malignant neoplasm of breast: Secondary | ICD-10-CM

## 2020-01-20 MED FILL — FARXIGA 10 MG TABLET: 10 | 30 days supply | Qty: 30 | Fill #0

## 2020-01-21 ENCOUNTER — Other Ambulatory Visit: Payer: Self-pay | Admitting: Family Medicine

## 2020-01-21 DIAGNOSIS — R928 Other abnormal and inconclusive findings on diagnostic imaging of breast: Secondary | ICD-10-CM

## 2020-02-01 ENCOUNTER — Other Ambulatory Visit: Payer: Self-pay

## 2020-02-01 ENCOUNTER — Ambulatory Visit
Admission: RE | Admit: 2020-02-01 | Discharge: 2020-02-01 | Disposition: A | Discharge status: Home / Self Care | Payer: 59 | Source: Ambulatory Visit | Attending: Family Medicine | Admitting: Family Medicine

## 2020-02-01 ENCOUNTER — Other Ambulatory Visit: Payer: Self-pay | Admitting: Family Medicine

## 2020-02-01 DIAGNOSIS — N6489 Other specified disorders of breast: Secondary | ICD-10-CM

## 2020-02-01 DIAGNOSIS — R928 Other abnormal and inconclusive findings on diagnostic imaging of breast: Secondary | ICD-10-CM

## 2020-02-01 DIAGNOSIS — R922 Inconclusive mammogram: Secondary | ICD-10-CM | POA: Diagnosis not present

## 2020-02-03 ENCOUNTER — Other Ambulatory Visit: Payer: Self-pay

## 2020-02-03 ENCOUNTER — Ambulatory Visit (INDEPENDENT_AMBULATORY_CARE_PROVIDER_SITE_OTHER): Payer: 59 | Admitting: Bariatrics

## 2020-02-03 ENCOUNTER — Encounter (INDEPENDENT_AMBULATORY_CARE_PROVIDER_SITE_OTHER): Payer: Self-pay | Admitting: Bariatrics

## 2020-02-03 VITALS — BP 100/68 | HR 90 | Temp 98.3°F | Ht 64.0 in | Wt 230.0 lb

## 2020-02-03 DIAGNOSIS — J302 Other seasonal allergic rhinitis: Secondary | ICD-10-CM | POA: Diagnosis not present

## 2020-02-03 DIAGNOSIS — Z1211 Encounter for screening for malignant neoplasm of colon: Secondary | ICD-10-CM | POA: Diagnosis not present

## 2020-02-03 DIAGNOSIS — E1169 Type 2 diabetes mellitus with other specified complication: Secondary | ICD-10-CM | POA: Diagnosis not present

## 2020-02-03 DIAGNOSIS — R194 Change in bowel habit: Secondary | ICD-10-CM | POA: Diagnosis not present

## 2020-02-03 DIAGNOSIS — Z6839 Body mass index (BMI) 39.0-39.9, adult: Secondary | ICD-10-CM

## 2020-02-03 DIAGNOSIS — M25522 Pain in left elbow: Secondary | ICD-10-CM | POA: Diagnosis not present

## 2020-02-03 DIAGNOSIS — Z9189 Other specified personal risk factors, not elsewhere classified: Secondary | ICD-10-CM

## 2020-02-03 DIAGNOSIS — Z800 Family history of malignant neoplasm of digestive organs: Secondary | ICD-10-CM | POA: Diagnosis not present

## 2020-02-03 DIAGNOSIS — E559 Vitamin D deficiency, unspecified: Secondary | ICD-10-CM

## 2020-02-03 DIAGNOSIS — E669 Obesity, unspecified: Secondary | ICD-10-CM | POA: Diagnosis not present

## 2020-02-03 MED ORDER — VITAMIN D3 1.25 MG (50000 UT) PO CAPS: 1.0000 | ORAL_CAPSULE | ORAL | 0 refills | Status: DC

## 2020-02-03 MED ORDER — FLUTICASONE PROPIONATE 50 MCG/ACT NA SUSP: 1.0000 | Freq: Every day | NASAL | 0 refills | Status: AC

## 2020-02-03 MED ORDER — CETIRIZINE HCL 10 MG PO TABS: 10.0000 mg | ORAL_TABLET | Freq: Every day | ORAL | 0 refills | Status: AC

## 2020-02-04 LAB — COMPREHENSIVE METABOLIC PANEL
ALT: 22 IU/L (ref 0–32)
AST: 13 IU/L (ref 0–40)
Albumin/Globulin Ratio: 1.6 (ref 1.2–2.2)
Albumin: 4.5 g/dL (ref 3.8–4.9)
Alkaline Phosphatase: 61 IU/L (ref 48–121)
BUN/Creatinine Ratio: 25 — ABNORMAL HIGH (ref 9–23)
BUN: 25 mg/dL — ABNORMAL HIGH (ref 6–24)
Bilirubin Total: 0.3 mg/dL (ref 0.0–1.2)
CO2: 24 mmol/L (ref 20–29)
Calcium: 9.9 mg/dL (ref 8.7–10.2)
Chloride: 99 mmol/L (ref 96–106)
Creatinine, Ser: 1 mg/dL (ref 0.57–1.00)
GFR calc Af Amer: 75 mL/min/{1.73_m2} (ref >59)
GFR calc non Af Amer: 65 mL/min/{1.73_m2} (ref >59)
Globulin, Total: 2.9 g/dL (ref 1.5–4.5)
Glucose: 107 mg/dL — ABNORMAL HIGH (ref 65–99)
Potassium: 3.5 mmol/L (ref 3.5–5.2)
Sodium: 143 mmol/L (ref 134–144)
Total Protein: 7.4 g/dL (ref 6.0–8.5)

## 2020-02-04 LAB — LIPID PANEL WITH LDL/HDL RATIO
Cholesterol, Total: 114 mg/dL (ref 100–199)
HDL: 47 mg/dL (ref >39)
LDL Chol Calc (NIH): 46 mg/dL (ref 0–99)
LDL/HDL Ratio: 1 ratio (ref 0.0–3.2)
Triglycerides: 117 mg/dL (ref 0–149)
VLDL Cholesterol Cal: 21 mg/dL (ref 5–40)

## 2020-02-04 LAB — HEMOGLOBIN A1C
Est. average glucose Bld gHb Est-mCnc: 143 mg/dL
Hgb A1c MFr Bld: 6.6 % — ABNORMAL HIGH (ref 4.8–5.6)

## 2020-02-04 LAB — VITAMIN D 25 HYDROXY (VIT D DEFICIENCY, FRACTURES): Vit D, 25-Hydroxy: 76.3 ng/mL (ref 30.0–100.0)

## 2020-02-04 MED FILL — CETIRIZINE HCL 10 MG TABS: 10 | 100 days supply | Qty: 100 | Fill #0

## 2020-02-04 MED FILL — VITAMIN D3 50,000 UNITS CAP: 1.25 MG | 28 days supply | Qty: 4 | Fill #0

## 2020-02-04 MED FILL — FLUTICASONE PROP 50 MCG SPR: 50 | 60 days supply | Qty: 16 | Fill #0

## 2020-02-07 ENCOUNTER — Encounter (INDEPENDENT_AMBULATORY_CARE_PROVIDER_SITE_OTHER): Payer: Self-pay | Admitting: Bariatrics

## 2020-02-07 NOTE — Progress Notes (Signed)
Chief Complaint:   OBESITY Rhonda Garza is here to discuss her progress with her obesity treatment plan along with follow-up of her obesity related diagnoses. Rhonda Garza is on the Category 3 Plan and states she is following her eating plan approximately 90% of the time. Rhonda Garza states she is walking/free weights 100 minutes 1 time per week.  Today's visit was #: 5 Starting weight: 260 lbs Starting date: 09/27/2019 Today's weight: 230 lbs Today's date: 02/03/2020 Total lbs lost to date: 30 Total lbs lost since last in-office visit: 12  Interim History: Rhonda Garza is down 12 lbs. She is exercising and has added in weights to her exercise regimen.  Subjective:   Seasonal allergies. Rhonda Garza's seasonal allergies are controlled with medications.  Vitamin D deficiency. No nausea, vomiting, or muscle weakness. Last Vitamin D 34.7 on 09/27/2019.  Type 2 diabetes mellitus with obesity (Rhonda Garza). Rhonda Garza is taking metformin.  Lab Results  Component Value Date   HGBA1C 6.6 (H) 02/03/2020   HGBA1C 7.9 (H) 09/27/2019   Lab Results  Component Value Date   LDLCALC 46 02/03/2020   CREATININE 1.00 02/03/2020   Lab Results  Component Value Date   INSULIN 31.4 (H) 09/27/2019   Left elbow pain. Rhonda Garza reports pain on movement and to palpation.  At risk for activity intolerance. Rhonda Garza is at risk of exercise intolerance secondary to arm pain and allergies.  Assessment/Plan:   Seasonal allergies. Prescriptions were given for cetirizine (ZYRTEC) 10 MG tablet 1 PO daily #30 with 0 refills and fluticasone (FLONASE) 50 MCG/ACT nasal spray both nostrils daily, 1 inhaler with 0 refills.  Vitamin D deficiency. Low Vitamin D level contributes to fatigue and are associated with obesity, breast, and colon cancer. She was given a prescription for Cholecalciferol (VITAMIN D3) 1.25 MG (50000 UT) CAPS every week #4 with 0 refills and VITAMIN D 25 Hydroxy (Vit-D Deficiency, Fractures) level was ordered  today.   Type 2 diabetes mellitus with obesity (Rhonda Garza). Good blood sugar control is important to decrease the likelihood of diabetic complications such as nephropathy, neuropathy, limb loss, blindness, coronary artery disease, and death. Intensive lifestyle modification including diet, exercise and weight loss are the first line of treatment for diabetes. Rhonda Garza will continue her medication as directed. Hemoglobin A1c ordered today.  Left elbow pain. Rhonda Garza will get an elbow brace and will consider seeing Sports Medicine.  At risk for activity intolerance. Rhonda Garza was given approximately 15 minutes of exercise intolerance counseling today. She is 54 y.o. female and has risk factors exercise intolerance including obesity. We discussed intensive lifestyle modifications today with an emphasis on specific weight loss instructions and strategies. Rhonda Garza will slowly increase activity as tolerated.  Repetitive spaced learning was employed today to elicit superior memory formation and behavioral change.  Class 2 severe obesity with serious comorbidity and body mass index (BMI) of 39.0 to 39.9 in adult, unspecified obesity type (Rhonda Garza).  Rhonda Garza is currently in the action stage of change. As such, her goal is to continue with weight loss efforts. She has agreed to the Category 3 Plan.   She will work on meal planning and intentional eating.  Exercise goals: All adults should avoid inactivity. Some physical activity is better than none, and adults who participate in any amount of physical activity gain some health benefits.  Behavioral modification strategies: increasing lean protein intake, decreasing simple carbohydrates, increasing vegetables, increasing water intake, decreasing eating out, no skipping meals, meal planning and cooking strategies, keeping healthy foods in the  home and planning for success.  Rhonda Garza has agreed to follow-up with our clinic in 2 weeks. She was informed of the importance of  frequent follow-up visits to maximize her success with intensive lifestyle modifications for her multiple health conditions.   Rhonda Garza was informed we would discuss her lab results at her next visit unless there is a critical issue that needs to be addressed sooner. Rhonda Garza agreed to keep her next visit at the agreed upon time to discuss these results.  Objective:   Blood pressure 100/68, pulse 90, temperature 98.3 F (36.8 C), height 5\' 4"  (1.626 m), weight 230 lb (104.3 kg), SpO2 97 %. Body mass index is 39.48 kg/m.  General: Cooperative, alert, well developed, in no acute distress. HEENT: Conjunctivae and lids unremarkable. Cardiovascular: Regular rhythm.  Lungs: Normal work of breathing. Neurologic: No focal deficits.   Lab Results  Component Value Date   CREATININE 1.00 02/03/2020   BUN 25 (H) 02/03/2020   NA 143 02/03/2020   K 3.5 02/03/2020   CL 99 02/03/2020   CO2 24 02/03/2020   Lab Results  Component Value Date   ALT 22 02/03/2020   AST 13 02/03/2020   ALKPHOS 61 02/03/2020   BILITOT 0.3 02/03/2020   Lab Results  Component Value Date   HGBA1C 6.6 (H) 02/03/2020   HGBA1C 7.9 (H) 09/27/2019   Lab Results  Component Value Date   INSULIN 31.4 (H) 09/27/2019   Lab Results  Component Value Date   TSH 2.460 09/27/2019   Lab Results  Component Value Date   CHOL 114 02/03/2020   HDL 47 02/03/2020   LDLCALC 46 02/03/2020   TRIG 117 02/03/2020   No results found for: WBC, HGB, HCT, MCV, PLT No results found for: IRON, TIBC, FERRITIN  Attestation Statements:   Reviewed by clinician on day of visit: allergies, medications, problem list, medical history, surgical history, family history, social history, and previous encounter notes.  Migdalia Dk, am acting as Location manager for [employer], DO   I have reviewed the above documentation for accuracy and completeness, and I agree with the above. Jearld Lesch, DO

## 2020-02-17 ENCOUNTER — Other Ambulatory Visit (HOSPITAL_COMMUNITY): Payer: Self-pay | Admitting: Surgery

## 2020-02-17 ENCOUNTER — Other Ambulatory Visit: Payer: Self-pay | Admitting: Surgery

## 2020-02-25 DIAGNOSIS — Z1211 Encounter for screening for malignant neoplasm of colon: Secondary | ICD-10-CM | POA: Diagnosis not present

## 2020-02-25 DIAGNOSIS — Z800 Family history of malignant neoplasm of digestive organs: Secondary | ICD-10-CM | POA: Diagnosis not present

## 2020-02-29 MED FILL — FARXIGA 10 MG TABLET: 10 | 30 days supply | Qty: 30 | Fill #1

## 2020-02-29 MED FILL — SIMVASTATIN 40 MG TABLET: 40 | 90 days supply | Qty: 90 | Fill #1

## 2020-02-29 MED FILL — VALSARTAN-HCTZ 320-25 MG TA: 320-25 | 90 days supply | Qty: 90 | Fill #1

## 2020-02-29 MED FILL — ESCITALOPRAM 10 MG TABLET: 10 | 90 days supply | Qty: 135 | Fill #1

## 2020-02-29 MED FILL — METFORMIN HCL 1000 MG TABS: 1000 | 90 days supply | Qty: 180 | Fill #1

## 2020-03-02 ENCOUNTER — Encounter (INDEPENDENT_AMBULATORY_CARE_PROVIDER_SITE_OTHER): Payer: Self-pay | Admitting: Bariatrics

## 2020-03-02 ENCOUNTER — Ambulatory Visit (INDEPENDENT_AMBULATORY_CARE_PROVIDER_SITE_OTHER): Payer: 59 | Admitting: Bariatrics

## 2020-03-02 ENCOUNTER — Other Ambulatory Visit: Payer: Self-pay

## 2020-03-02 ENCOUNTER — Other Ambulatory Visit (INDEPENDENT_AMBULATORY_CARE_PROVIDER_SITE_OTHER): Payer: Self-pay | Admitting: Bariatrics

## 2020-03-02 VITALS — BP 105/75 | HR 79 | Temp 98.7°F | Ht 64.0 in | Wt 227.0 lb

## 2020-03-02 DIAGNOSIS — E119 Type 2 diabetes mellitus without complications: Secondary | ICD-10-CM

## 2020-03-02 DIAGNOSIS — E559 Vitamin D deficiency, unspecified: Secondary | ICD-10-CM | POA: Diagnosis not present

## 2020-03-02 DIAGNOSIS — Z6838 Body mass index (BMI) 38.0-38.9, adult: Secondary | ICD-10-CM

## 2020-03-02 DIAGNOSIS — Z9189 Other specified personal risk factors, not elsewhere classified: Secondary | ICD-10-CM | POA: Diagnosis not present

## 2020-03-02 MED ORDER — VITAMIN D3 1.25 MG (50000 UT) PO CAPS: 1.0000 | ORAL_CAPSULE | ORAL | 0 refills | Status: DC

## 2020-03-02 MED FILL — VIT D3-50 50,000 UNITS CAPS: 1.25 MG | 28 days supply | Qty: 4 | Fill #0

## 2020-03-02 NOTE — Progress Notes (Signed)
Chief Complaint:   OBESITY Rhonda Garza is here to discuss her progress with her obesity treatment plan along with follow-up of her obesity related diagnoses. Rhonda Garza is on the Category 3 Plan and states she is following her eating plan approximately 80% of the time. Rhonda Garza states she is walking 30-45 minutes 3-4 times per week and hand weights 10 minutes 3-4 times per week.  Today's visit was #: 6 Starting weight: 260 lbs Starting date: 09/27/2019 Today's weight: 227 lbs Today's date: 03/02/2020 Total lbs lost to date: 33 Total lbs lost since last in-office visit: 3  Interim History: Rhonda Garza is down an additional 3 lbs and doing very well overall.  Subjective:   Type 2 diabetes mellitus without complication, without long-term current use of insulin (Rhonda Garza). Rhonda Garza is taking Victoza and Glucophage.   Lab Results  Component Value Date   HGBA1C 6.6 (H) 02/03/2020   HGBA1C 7.9 (H) 09/27/2019   Lab Results  Component Value Date   LDLCALC 46 02/03/2020   CREATININE 1.00 02/03/2020   Lab Results  Component Value Date   INSULIN 31.4 (H) 09/27/2019   Vitamin D deficiency. No nausea, vomiting, or muscle weakness. Last Vitamin D was 76.3 on 02/03/2020.  At risk for osteoporosis. Rhonda Garza is at higher risk of osteopenia and osteoporosis due to Vitamin D deficiency.   Assessment/Plan:   Type 2 diabetes mellitus without complication, without long-term current use of insulin (Rhonda Garza). Good blood sugar control is important to decrease the likelihood of diabetic complications such as nephropathy, neuropathy, limb loss, blindness, coronary artery disease, and death. Intensive lifestyle modification including diet, exercise and weight loss are the first line of treatment for diabetes. Rhonda Garza will continue her medications as directed.  Vitamin D deficiency. Low Vitamin D level contributes to fatigue and are associated with obesity, breast, and colon cancer. She was given a prescription  for Cholecalciferol (VITAMIN D3) 1.25 MG (50000 UT) CAPS every week #12 with 0 refills and will follow-up for routine testing of Vitamin D, at least 2-3 times per year to avoid over-replacement.   At risk for osteoporosis. Rhonda Garza was given approximately 15 minutes of osteoporosis prevention counseling today. Rhonda Garza is at risk for osteopenia and osteoporosis due to her Vitamin D deficiency. She was encouraged to take her Vitamin D and follow her higher calcium diet and increase strengthening exercise to help strengthen her bones and decrease her risk of osteopenia and osteoporosis.  Repetitive spaced learning was employed today to elicit superior memory formation and behavioral change.  Class 2 severe obesity with serious comorbidity and body mass index (BMI) of 38.0 to 38.9 in adult, unspecified obesity type (Rhonda Garza).  Rhonda Garza is currently in the action stage of change. As such, her goal is to continue with weight loss efforts. She has agreed to the Category 3 Plan.   She will work on meal planning and intentional eating.   Exercise goals: All adults should avoid inactivity. Some physical activity is better than none, and adults who participate in any amount of physical activity gain some health benefits.  Behavioral modification strategies: increasing lean protein intake, decreasing simple carbohydrates, increasing vegetables, increasing water intake, decreasing eating out, no skipping meals, meal planning and cooking strategies, keeping healthy foods in the home and planning for success.  Rhonda Garza has agreed to follow-up with our clinic in 2 weeks. She was informed of the importance of frequent follow-up visits to maximize her success with intensive lifestyle modifications for her multiple health conditions.  Objective:   Blood pressure 105/75, pulse 79, temperature 98.7 F (37.1 C), height 5\' 4"  (1.626 m), weight 227 lb (103 kg). Body mass index is 38.96 kg/m.  General: Cooperative, alert,  well developed, in no acute distress. HEENT: Conjunctivae and lids unremarkable. Cardiovascular: Regular rhythm.  Lungs: Normal work of breathing. Neurologic: No focal deficits.   Lab Results  Component Value Date   CREATININE 1.00 02/03/2020   BUN 25 (H) 02/03/2020   NA 143 02/03/2020   K 3.5 02/03/2020   CL 99 02/03/2020   CO2 24 02/03/2020   Lab Results  Component Value Date   ALT 22 02/03/2020   AST 13 02/03/2020   ALKPHOS 61 02/03/2020   BILITOT 0.3 02/03/2020   Lab Results  Component Value Date   HGBA1C 6.6 (H) 02/03/2020   HGBA1C 7.9 (H) 09/27/2019   Lab Results  Component Value Date   INSULIN 31.4 (H) 09/27/2019   Lab Results  Component Value Date   TSH 2.460 09/27/2019   Lab Results  Component Value Date   CHOL 114 02/03/2020   HDL 47 02/03/2020   LDLCALC 46 02/03/2020   TRIG 117 02/03/2020   No results found for: WBC, HGB, HCT, MCV, PLT No results found for: IRON, TIBC, FERRITIN  Attestation Statements:   Reviewed by clinician on day of visit: allergies, medications, problem list, medical history, surgical history, family history, social history, and previous encounter notes.  Migdalia Dk, am acting as Location manager for [employer], DO   I have reviewed the above documentation for accuracy and completeness, and I agree with the above. Jearld Lesch, DO

## 2020-03-06 ENCOUNTER — Encounter (INDEPENDENT_AMBULATORY_CARE_PROVIDER_SITE_OTHER): Payer: Self-pay | Admitting: Bariatrics

## 2020-03-08 MED FILL — VITAMIN D3 50,000 UNITS CAP: 1.25 MG | 28 days supply | Qty: 4 | Fill #0

## 2020-03-09 ENCOUNTER — Ambulatory Visit (HOSPITAL_COMMUNITY): Payer: 59

## 2020-03-15 ENCOUNTER — Ambulatory Visit (INDEPENDENT_AMBULATORY_CARE_PROVIDER_SITE_OTHER): Payer: 59 | Admitting: Psychology

## 2020-03-15 DIAGNOSIS — F509 Eating disorder, unspecified: Secondary | ICD-10-CM | POA: Diagnosis not present

## 2020-03-16 ENCOUNTER — Ambulatory Visit (HOSPITAL_COMMUNITY)
Admission: RE | Admit: 2020-03-16 | Discharge: 2020-03-16 | Disposition: A | Discharge status: Home / Self Care | Payer: 59 | Source: Ambulatory Visit | Attending: Surgery | Admitting: Surgery

## 2020-03-16 ENCOUNTER — Other Ambulatory Visit: Payer: Self-pay

## 2020-03-16 DIAGNOSIS — Z01818 Encounter for other preprocedural examination: Secondary | ICD-10-CM | POA: Diagnosis not present

## 2020-03-21 ENCOUNTER — Telehealth (INDEPENDENT_AMBULATORY_CARE_PROVIDER_SITE_OTHER): Payer: Self-pay | Admitting: Psychology

## 2020-03-21 ENCOUNTER — Ambulatory Visit (INDEPENDENT_AMBULATORY_CARE_PROVIDER_SITE_OTHER): Payer: 59 | Admitting: Psychology

## 2020-03-21 NOTE — Telephone Encounter (Signed)
  Office: [phone]  /  Fax: [phone]  Date of Call: March 21, 2020  Time of Call: 12:56pm Duration of Call: 4 minutes Provider: Glennie Isle, PsyD  CONTENT: This provider called Rhonda Garza back as Rhonda Garza sent this provider an e-mail on March 21, 2020 indicating the following:   "Good morning [name],   I hope you are off to a great day.    I'm reaching out to ask if you would be willing to write a letter to support with me moving forward to get the gastric sleeve surgery? I don't have a surgery date as of yet as all of the pre-op meetings (nutrition, pych, etc must happen for the prior auth first). I had a wonderful meeting with [name] on June 30th (virtual). I mentioned I met with you a handful of times (virtually) when we talked about mindfulness and ways I've focused and when I mentioned being a part of [facility].  He said he felt like it would help my case to reach out to you and ask for your support in way of a letter. I told him we had great conversations, and I absolutely felt comfortable reaching out to you. If you feel comfortable with writing a letter of support, he recommended you emailing the letter directly to me, and then I will upload it via My Chart. I meet with [name] again today at 4:00 pm for our second and last visit before my hopeful upcoming procedure.  I told him I was going to email this request to you towards the end of last week, but didn't due to a slew of work commitments that overtook then on call.    Please let me know what you think about helping with this. I appreciate your time and support just as when we were meeting. Thank you.    Take care-"  This provider explained to [facility] the scope of services with this provider was on emotional eating and not the process for bariatric surgery. [name] acknowledged understanding that this provider would be unable to write the letter, adding she would inform [name] with [name].   PLAN: No further follow-up planned by this provider.

## 2020-03-22 ENCOUNTER — Ambulatory Visit: Payer: 59 | Admitting: Dietician

## 2020-03-24 MED FILL — UNIFINE PENTIPS 31GX3/16: 31G X 5 MM | 90 days supply | Qty: 100 | Fill #0

## 2020-03-24 MED FILL — OZEMPIC (1 MG/DOSE) 4 MG/3M: 4 | 84 days supply | Qty: 9 | Fill #0

## 2020-03-24 MED FILL — FARXIGA 10 MG TABLET: 10 | 30 days supply | Qty: 30 | Fill #2

## 2020-03-28 ENCOUNTER — Encounter: Payer: Self-pay | Admitting: Dietician

## 2020-03-28 ENCOUNTER — Other Ambulatory Visit: Payer: Self-pay

## 2020-03-28 ENCOUNTER — Encounter: Payer: 59 | Attending: Surgery | Admitting: Dietician

## 2020-03-28 DIAGNOSIS — E669 Obesity, unspecified: Secondary | ICD-10-CM | POA: Diagnosis not present

## 2020-03-28 NOTE — Patient Instructions (Signed)
It was nice meeting you today! Begin working through the [employer] discussed today, starting with 1 or 2 you feel would be the most challenging/ beneficial for you.   We will see you at Pre-Op Class!

## 2020-03-28 NOTE — Progress Notes (Signed)
Nutrition Assessment for Bariatric Surgery Medical Nutrition Therapy   Patient was seen on 03/28/2020 for Pre-Operative Nutrition Assessment. Letter of approval faxed to [facility] bariatric surgery program coordinator on 03/28/2020.   Referral stated Supervised Weight Loss (SWL) visits needed: 0  Planned surgery: Sleeve Gastrectomy  Pt expectation of surgery: to help improve A1c, lose weight and keep it off, live longer and be around for kids/grandkids    NUTRITION ASSESSMENT   Anthropometrics  Start weight at [facility]: 224.6 lbs (date: 03/28/2020) Height: 63 in BMI: 39.8 kg/m2     Lifestyle & Dietary Hx Patient states she knows others who have had bariatric surgery. States she recently lost ~40 lbs through [facility] and has considered not having surgery since she lost weight successfully on her own. States she does not eat much throughout the day and doesn't have much of an appetite, partially related to medications.   Any previous deficiencies: no  Micronutrient Nutrition Focused Physical Exam: Hair: no issues observed Eyes: no issues observed Mouth: no issues observed Neck: no issues observed Nails: no issues observed Skin: no issues observed  24-Hr Dietary Recall First Meal: low sugar Greek yogurt  Snack: - Second Meal: [place] deli meat Snack: 100-calorie snack (or fruit)  Third Meal: salmon  Snack: - Beverages: water, diet green tea  Estimated Energy Needs Calories: 1600 Carbohydrate: 180g Protein: 100g Fat: 53g   NUTRITION DIAGNOSIS  Overweight/obesity ([place]-3.3) related to past poor dietary habits and physical inactivity as evidenced by patient w/ planned Sleeve Gastrectomy surgery following dietary guidelines for continued weight loss.    NUTRITION INTERVENTION  Nutrition counseling (C-1) and education (E-2) to facilitate bariatric surgery goals.  Pre-Op Goals Reviewed with the Patient . Track food and beverage intake (pen  and paper, MyFitness Pal, Baritastic app, etc.) . Make healthy food choices while monitoring portion sizes . Consume 3 meals per day or try to eat every 3-5 hours . Avoid concentrated sugars and fried foods . Keep sugar & fat in the single digits per serving on food labels . Practice CHEWING your food (aim for applesauce consistency) . Practice not drinking 15 minutes before, during, and 30 minutes after each meal and snack . Avoid all carbonated beverages (ex: soda, sparkling beverages)  . Limit caffeinated beverages (ex: coffee, tea, energy drinks) . Avoid all sugar-sweetened beverages (ex: regular soda, sports drinks)  . Avoid alcohol  . Aim for 64-100 ounces of FLUID daily (with at least half of fluid intake being plain water)  . Aim for at least 60-80 grams of PROTEIN daily . Look for a liquid protein source that contains ?15 g protein and ?5 g carbohydrate (ex: shakes, drinks, shots) . Make a list of non-food related activities . Physical activity is an important part of a healthy lifestyle so keep it moving! The goal is to reach 150 minutes of exercise per week, including cardiovascular and weight baring activity.  Handouts Provided Include  . Bariatric Surgery handouts (Nutrition Visits, Pre-Op Goals, Protein Shakes, Vitamins & Minerals)  Learning Style & Readiness for Change Teaching method utilized: Visual & Auditory  Demonstrated degree of understanding via: Teach Back  Barriers to learning/adherence to lifestyle change: None Identified    MONITORING & EVALUATION Dietary intake, weekly physical activity, body weight, and pre-op goals reached at next nutrition visit.   Next Steps Patient is to follow up at [facility] for Pre-Op Class (>2 weeks before surgery) for further nutrition education.

## 2020-04-05 MED FILL — TOPIRAMATE 50 MG TABLET: 50 | 90 days supply | Qty: 135 | Fill #1

## 2020-04-11 ENCOUNTER — Ambulatory Visit: Payer: 59 | Admitting: Dietician

## 2020-04-13 ENCOUNTER — Other Ambulatory Visit (HOSPITAL_COMMUNITY): Payer: Self-pay | Admitting: Physician Assistant

## 2020-04-13 DIAGNOSIS — E785 Hyperlipidemia, unspecified: Secondary | ICD-10-CM | POA: Diagnosis not present

## 2020-04-13 DIAGNOSIS — F5081 Binge eating disorder: Secondary | ICD-10-CM | POA: Diagnosis not present

## 2020-04-13 DIAGNOSIS — R6889 Other general symptoms and signs: Secondary | ICD-10-CM | POA: Diagnosis not present

## 2020-04-13 DIAGNOSIS — E1159 Type 2 diabetes mellitus with other circulatory complications: Secondary | ICD-10-CM | POA: Diagnosis not present

## 2020-04-13 DIAGNOSIS — Z6838 Body mass index (BMI) 38.0-38.9, adult: Secondary | ICD-10-CM | POA: Diagnosis not present

## 2020-04-13 DIAGNOSIS — I152 Hypertension secondary to endocrine disorders: Secondary | ICD-10-CM | POA: Diagnosis not present

## 2020-04-13 DIAGNOSIS — E1169 Type 2 diabetes mellitus with other specified complication: Secondary | ICD-10-CM | POA: Diagnosis not present

## 2020-04-18 MED FILL — VALSARTAN-HCTZ 160-25 MG TA: 160-25 | 90 days supply | Qty: 90 | Fill #0

## 2020-04-20 ENCOUNTER — Ambulatory Visit (INDEPENDENT_AMBULATORY_CARE_PROVIDER_SITE_OTHER): Payer: 59 | Admitting: Bariatrics

## 2020-04-20 ENCOUNTER — Other Ambulatory Visit: Payer: Self-pay

## 2020-04-20 ENCOUNTER — Encounter (INDEPENDENT_AMBULATORY_CARE_PROVIDER_SITE_OTHER): Payer: Self-pay | Admitting: Bariatrics

## 2020-04-20 VITALS — BP 94/62 | HR 91 | Temp 98.1°F | Ht 64.0 in | Wt 216.0 lb

## 2020-04-20 DIAGNOSIS — Z6837 Body mass index (BMI) 37.0-37.9, adult: Secondary | ICD-10-CM | POA: Diagnosis not present

## 2020-04-20 DIAGNOSIS — Z9189 Other specified personal risk factors, not elsewhere classified: Secondary | ICD-10-CM | POA: Diagnosis not present

## 2020-04-20 DIAGNOSIS — E1169 Type 2 diabetes mellitus with other specified complication: Secondary | ICD-10-CM | POA: Diagnosis not present

## 2020-04-20 DIAGNOSIS — E669 Obesity, unspecified: Secondary | ICD-10-CM | POA: Diagnosis not present

## 2020-04-20 DIAGNOSIS — E559 Vitamin D deficiency, unspecified: Secondary | ICD-10-CM

## 2020-04-20 MED ORDER — VITAMIN D3 1.25 MG (50000 UT) PO CAPS: 1.0000 | ORAL_CAPSULE | ORAL | 0 refills | Status: DC

## 2020-04-20 MED FILL — VITAMIN D3 50,000 UNITS CAP: 1.25 MG | 28 days supply | Qty: 4 | Fill #0

## 2020-04-24 DIAGNOSIS — E785 Hyperlipidemia, unspecified: Secondary | ICD-10-CM | POA: Insufficient documentation

## 2020-04-24 DIAGNOSIS — I152 Hypertension secondary to endocrine disorders: Secondary | ICD-10-CM | POA: Insufficient documentation

## 2020-04-24 NOTE — Progress Notes (Signed)
Chief Complaint:   OBESITY Rhonda Garza is here to discuss her progress with her obesity treatment plan along with follow-up of her obesity related diagnoses. Rhonda Garza is on the Category 3 Plan and states she is following her eating plan approximately 80% of the time. Rhonda Garza states she is walking 30 minutes 3 times per week.  Today's visit was #: 7 Starting weight: 260 lbs Starting date: 09/27/2019 Today's weight: 216 lbs Today's date: 04/20/2020  Total lbs lost to date: 44 Total lbs lost since last in-office visit: 11  Interim History: Rhonda Garza is down 11 lbs since her last appointment on 03/02/2020 and she is doing very well overall.  Subjective:   Vitamin D deficiency. No nausea, vomiting, or muscle weakness.    Ref. Range 02/03/2020 13:33  Vitamin D, 25-Hydroxy Latest Ref Range: 30.0 - 100.0 ng/mL 76.3   Type 2 diabetes mellitus with obesity ([place]). [name] is taking metformin.   Lab Results  Component Value Date   HGBA1C 6.6 (H) 02/03/2020   HGBA1C 7.9 (H) 09/27/2019   Lab Results  Component Value Date   LDLCALC 46 02/03/2020   CREATININE 1.00 02/03/2020   Lab Results  Component Value Date   INSULIN 31.4 (H) 09/27/2019   At risk for hypoglycemia. Rhonda Garza is at increased risk for hypoglycemia due to diabetes mellitus and dietary changes.   Assessment/Plan:   Vitamin D deficiency. Low Vitamin D level contributes to fatigue and are associated with obesity, breast, and colon cancer. She was given a prescription for Cholecalciferol (VITAMIN D3) 1.25 MG (50000 UT) CAPS every week #4 with 0 refills and will follow-up for routine testing of Vitamin D, at least 2-3 times per year to avoid over-replacement.   Type 2 diabetes mellitus with obesity ([place]). Good blood sugar control is important to decrease the likelihood of diabetic complications such as nephropathy, neuropathy, limb loss, blindness, coronary artery disease, and death. Intensive lifestyle modification  including diet, exercise and weight loss are the first line of treatment for diabetes. Rhonda Garza will continue metformin as directed.   At risk for hypoglycemia. Rhonda Garza was given approximately 15 minutes of counseling today regarding prevention of hypoglycemia. She was advised of symptoms of hypoglycemia. Rhonda Garza was instructed to avoid skipping meals, eat regular protein rich meals and schedule low calorie snacks as needed.   Repetitive spaced learning was employed today to elicit superior memory formation and behavioral change.  Class 2 severe obesity with serious comorbidity and body mass index (BMI) of 37.0 to 37.9 in adult, unspecified obesity type ([name]).  Rhonda Garza is currently in the action stage of change. As such, her goal is to continue with weight loss efforts. She has agreed to the Category 3 Plan.   She will work on meal planning and intentional eating.   Exercise goals: All adults should avoid inactivity. Some physical activity is better than none, and adults who participate in any amount of physical activity gain some health benefits.  Behavioral modification strategies: increasing lean protein intake, decreasing simple carbohydrates, increasing vegetables, increasing water intake, decreasing eating out, no skipping meals, meal planning and cooking strategies, keeping healthy foods in the home and planning for success.  Rhonda Garza has agreed to follow-up with our clinic in 4 weeks. She was informed of the importance of frequent follow-up visits to maximize her success with intensive lifestyle modifications for her multiple health conditions.   Objective:   Blood pressure 94/62, pulse 91, temperature 98.1 F (36.7 C), height 5\' 4"  (1.626 m),  weight 216 lb (98 kg), SpO2 96 %. Body mass index is 37.08 kg/m.  General: Cooperative, alert, well developed, in no acute distress. HEENT: Conjunctivae and lids unremarkable. Cardiovascular: Regular rhythm.  Lungs: Normal work of  breathing. Neurologic: No focal deficits.   Lab Results  Component Value Date   CREATININE 1.00 02/03/2020   BUN 25 (H) 02/03/2020   NA 143 02/03/2020   K 3.5 02/03/2020   CL 99 02/03/2020   CO2 24 02/03/2020   Lab Results  Component Value Date   ALT 22 02/03/2020   AST 13 02/03/2020   ALKPHOS 61 02/03/2020   BILITOT 0.3 02/03/2020   Lab Results  Component Value Date   HGBA1C 6.6 (H) 02/03/2020   HGBA1C 7.9 (H) 09/27/2019   Lab Results  Component Value Date   INSULIN 31.4 (H) 09/27/2019   Lab Results  Component Value Date   TSH 2.460 09/27/2019   Lab Results  Component Value Date   CHOL 114 02/03/2020   HDL 47 02/03/2020   LDLCALC 46 02/03/2020   TRIG 117 02/03/2020   No results found for: WBC, HGB, HCT, MCV, PLT No results found for: IRON, TIBC, FERRITIN  Attestation Statements:   Reviewed by clinician on day of visit: allergies, medications, problem list, medical history, surgical history, family history, social history, and previous encounter notes.  Migdalia Dk, am acting as Location manager for [employer], DO   I have reviewed the above documentation for accuracy and completeness, and I agree with the above. Jearld Lesch, DO

## 2020-05-31 ENCOUNTER — Encounter (INDEPENDENT_AMBULATORY_CARE_PROVIDER_SITE_OTHER): Payer: Self-pay | Admitting: Family Medicine

## 2020-05-31 ENCOUNTER — Ambulatory Visit (INDEPENDENT_AMBULATORY_CARE_PROVIDER_SITE_OTHER): Payer: 59 | Admitting: Bariatrics

## 2020-05-31 ENCOUNTER — Ambulatory Visit (INDEPENDENT_AMBULATORY_CARE_PROVIDER_SITE_OTHER): Payer: 59 | Admitting: Family Medicine

## 2020-05-31 ENCOUNTER — Other Ambulatory Visit: Payer: Self-pay

## 2020-05-31 VITALS — BP 112/72 | HR 86 | Temp 98.0°F | Ht 64.0 in | Wt 207.0 lb

## 2020-05-31 DIAGNOSIS — Z6835 Body mass index (BMI) 35.0-35.9, adult: Secondary | ICD-10-CM | POA: Diagnosis not present

## 2020-05-31 DIAGNOSIS — E559 Vitamin D deficiency, unspecified: Secondary | ICD-10-CM | POA: Diagnosis not present

## 2020-05-31 DIAGNOSIS — R55 Syncope and collapse: Secondary | ICD-10-CM

## 2020-05-31 DIAGNOSIS — Z9189 Other specified personal risk factors, not elsewhere classified: Secondary | ICD-10-CM | POA: Diagnosis not present

## 2020-05-31 MED ORDER — VITAMIN D3 1.25 MG (50000 UT) PO CAPS: 1.0000 | ORAL_CAPSULE | ORAL | 0 refills | Status: AC

## 2020-05-31 MED FILL — VITAMIN D3 50,000 UNITS CAP: 1.25 MG | 28 days supply | Qty: 4 | Fill #0

## 2020-06-01 LAB — COMPREHENSIVE METABOLIC PANEL
ALT: 21 IU/L (ref 0–32)
AST: 11 IU/L (ref 0–40)
Albumin/Globulin Ratio: 2 (ref 1.2–2.2)
Albumin: 4.8 g/dL (ref 3.8–4.9)
Alkaline Phosphatase: 64 IU/L (ref 44–121)
BUN/Creatinine Ratio: 27 — ABNORMAL HIGH (ref 9–23)
BUN: 32 mg/dL — ABNORMAL HIGH (ref 6–24)
Bilirubin Total: 0.3 mg/dL (ref 0.0–1.2)
CO2: 27 mmol/L (ref 20–29)
Calcium: 10 mg/dL (ref 8.7–10.2)
Chloride: 99 mmol/L (ref 96–106)
Creatinine, Ser: 1.17 mg/dL — ABNORMAL HIGH (ref 0.57–1.00)
GFR calc Af Amer: 61 mL/min/{1.73_m2} (ref >59)
GFR calc non Af Amer: 53 mL/min/{1.73_m2} — ABNORMAL LOW (ref >59)
Globulin, Total: 2.4 g/dL (ref 1.5–4.5)
Glucose: 98 mg/dL (ref 65–99)
Potassium: 3.8 mmol/L (ref 3.5–5.2)
Sodium: 141 mmol/L (ref 134–144)
Total Protein: 7.2 g/dL (ref 6.0–8.5)

## 2020-06-01 LAB — CBC WITH DIFFERENTIAL/PLATELET
Basophils Absolute: 0.1 10*3/uL (ref 0.0–0.2)
Basos: 1 %
EOS (ABSOLUTE): 0.2 10*3/uL (ref 0.0–0.4)
Eos: 2 %
Hematocrit: 40.2 % (ref 34.0–46.6)
Hemoglobin: 14.2 g/dL (ref 11.1–15.9)
Immature Grans (Abs): 0 10*3/uL (ref 0.0–0.1)
Immature Granulocytes: 0 %
Lymphocytes Absolute: 2.6 10*3/uL (ref 0.7–3.1)
Lymphs: 31 %
MCH: 31 pg (ref 26.6–33.0)
MCHC: 35.3 g/dL (ref 31.5–35.7)
MCV: 88 fL (ref 79–97)
Monocytes Absolute: 0.4 10*3/uL (ref 0.1–0.9)
Monocytes: 5 %
Neutrophils Absolute: 5 10*3/uL (ref 1.4–7.0)
Neutrophils: 61 %
Platelets: 315 10*3/uL (ref 150–450)
RBC: 4.58 x10E6/uL (ref 3.77–5.28)
RDW: 13.5 % (ref 11.7–15.4)
WBC: 8.3 10*3/uL (ref 3.4–10.8)

## 2020-06-01 LAB — THYROID PANEL WITH TSH
Free Thyroxine Index: 1.9 (ref 1.2–4.9)
T3 Uptake Ratio: 25 % (ref 24–39)
T4, Total: 7.5 ug/dL (ref 4.5–12.0)
TSH: 1.93 u[IU]/mL (ref 0.450–4.500)

## 2020-06-01 LAB — VITAMIN D 25 HYDROXY (VIT D DEFICIENCY, FRACTURES): Vit D, 25-Hydroxy: 82.6 ng/mL (ref 30.0–100.0)

## 2020-06-01 NOTE — Progress Notes (Signed)
Chief Complaint:   OBESITY Rhonda Garza is here to discuss her progress with her obesity treatment plan along with follow-up of her obesity related diagnoses. Rhonda Garza is on the Category 3 Plan and states she is following her eating plan approximately 65% of the time. Rhonda Garza states she is walking and doing yoga for 30 minutes 3 times per week.  Today's visit was #: 8 Starting weight: 260 lbs Starting date: 09/27/2019 Today's weight: 207 lbs Today's date: 05/31/2020 Total lbs lost to date: 53 Total lbs lost since last in-office visit: 9  Interim History: Rhonda Garza notes her hunger is satisfied. She is not eating all of the protein on the plan because she feels the lunch meat is too salty. She does not always eat extra snack calories. She is still considering the possibility of weight loss surgery.  Subjective:   1. Vitamin D deficiency Josiah's Vit D level is at goal. She is on prescription Vit D.  2. Pre-syncope Rhonda Garza had an episode after a hot yoga class when she felt she was about to pass out, and felt overheated. She denies chest pain or shortness of breath.  3. At risk for deficient intake of food The patient is at a higher than average risk of deficient intake of food due to lack of protein.  Assessment/Plan:   1. Vitamin D deficiency  We will check labs today, and we will refill prescription Vitamin D for 1 month. - Cholecalciferol (VITAMIN D3) 1.25 MG (50000 UT) CAPS; Take 1 Dose by mouth once a week.  Dispense: 4 capsule; Refill: 0 - VITAMIN D 25 Hydroxy (Vit-D Deficiency, Fractures)  2. Pre-syncope LIkely due to dehydration. We will check labs today. Encouraged adequate hydration when exercising.   - CBC with Differential/Platelet - Comprehensive metabolic panel - Thyroid Panel With TSH  3. At risk for deficient intake of food Rhonda Garza was given approximately 15 minutes of deficit intake of food prevention counseling today. Rhonda Garza is at risk for eating too little protien  based on current food recall. She was encouraged to focus on meeting caloric and protein goals according to her recommended meal plan.   4. Class 2 severe obesity with serious comorbidity and body mass index (BMI) of 35.0 to 35.9 in adult, unspecified obesity type (H[facility]) Rhonda Garza is currently in the action stage of change. As such, her goal is to continue with weight loss efforts. She has agreed to the Category 3 Plan.   Handout given today: Protein Equivalents.  Exercise goals: As is.  Behavioral modification strategies: increasing lean protein intake and meal planning and cooking strategies.  Rhonda Garza has agreed to follow-up with our clinic in 2 to 3 weeks. Rhonda Garza was informed we would discuss her lab results at her next visit unless there is a critical issue that needs to be addressed sooner. Rhonda Garza agreed to keep her next visit at the agreed upon time to discuss these results.  Objective:   Blood pressure 112/72, pulse 86, temperature 98 F (36.7 C), temperature source Oral, height 5\' 4"  (1.626 m), weight 207 lb (93.9 kg), SpO2 93 %. Body mass index is 35.53 kg/m.  General: Cooperative, alert, well developed, in no acute distress. HEENT: Conjunctivae and lids unremarkable. Cardiovascular: Regular rhythm.  Lungs: Normal work of breathing. Neurologic: No focal deficits.   Lab Results  Component Value Date   CREATININE 1.17 (H) 05/31/2020   BUN 32 (H) 05/31/2020   NA 141 05/31/2020   K 3.8 05/31/2020   CL 99 05/31/2020  CO2 27 05/31/2020   Lab Results  Component Value Date   ALT 21 05/31/2020   AST 11 05/31/2020   ALKPHOS 64 05/31/2020   BILITOT 0.3 05/31/2020   Lab Results  Component Value Date   HGBA1C 6.6 (H) 02/03/2020   HGBA1C 7.9 (H) 09/27/2019   Lab Results  Component Value Date   INSULIN 31.4 (H) 09/27/2019   Lab Results  Component Value Date   TSH 1.930 05/31/2020   Lab Results  Component Value Date   CHOL 114 02/03/2020   HDL 47 02/03/2020   LDLCALC  46 02/03/2020   TRIG 117 02/03/2020   Lab Results  Component Value Date   WBC 8.3 05/31/2020   HGB 14.2 05/31/2020   HCT 40.2 05/31/2020   MCV 88 05/31/2020   PLT 315 05/31/2020   No results found for: IRON, TIBC, FERRITIN  Attestation Statements:   Reviewed by clinician on day of visit: allergies, medications, problem list, medical history, surgical history, family history, social history, and previous encounter notes.   Wilhemena Durie, am acting as Location manager for [employer], FNP-C.  I have reviewed the above documentation for accuracy and completeness, and I agree with the above. -  Georgianne Fick, FNP

## 2020-06-05 ENCOUNTER — Encounter (INDEPENDENT_AMBULATORY_CARE_PROVIDER_SITE_OTHER): Payer: Self-pay | Admitting: Family Medicine

## 2020-06-05 DIAGNOSIS — E559 Vitamin D deficiency, unspecified: Secondary | ICD-10-CM | POA: Insufficient documentation

## 2020-06-15 ENCOUNTER — Other Ambulatory Visit (HOSPITAL_COMMUNITY): Payer: Self-pay | Admitting: Family Medicine

## 2020-06-15 MED FILL — METFORMIN HCL 1000 MG TABS: 1000 | 90 days supply | Qty: 180 | Fill #0

## 2020-06-15 MED FILL — OZEMPIC (1 MG/DOSE) 4 MG/3M: 4 | 84 days supply | Qty: 9 | Fill #1

## 2020-06-15 MED FILL — ESCITALOPRAM 10 MG TABLET: 10 | 90 days supply | Qty: 135 | Fill #0

## 2020-06-15 MED FILL — SIMVASTATIN 40 MG TABLET: 40 | 90 days supply | Qty: 90 | Fill #0

## 2020-06-17 MED FILL — TOPIRAMATE 50 MG TABLET: 50 | 90 days supply | Qty: 135 | Fill #0

## 2020-07-04 DIAGNOSIS — Z6835 Body mass index (BMI) 35.0-35.9, adult: Secondary | ICD-10-CM | POA: Diagnosis not present

## 2020-07-04 DIAGNOSIS — Z01419 Encounter for gynecological examination (general) (routine) without abnormal findings: Secondary | ICD-10-CM | POA: Diagnosis not present

## 2020-08-04 ENCOUNTER — Other Ambulatory Visit: Payer: Self-pay | Admitting: Hematology and Oncology

## 2020-08-04 DIAGNOSIS — N6489 Other specified disorders of breast: Secondary | ICD-10-CM

## 2020-08-07 ENCOUNTER — Ambulatory Visit
Admission: RE | Admit: 2020-08-07 | Discharge: 2020-08-07 | Disposition: A | Discharge status: Home / Self Care | Payer: Self-pay | Source: Ambulatory Visit | Attending: Family Medicine | Admitting: Family Medicine

## 2020-08-07 ENCOUNTER — Ambulatory Visit
Admission: RE | Admit: 2020-08-07 | Discharge: 2020-08-07 | Disposition: A | Discharge status: Home / Self Care | Payer: 59 | Source: Ambulatory Visit | Attending: Hematology and Oncology | Admitting: Hematology and Oncology

## 2020-08-07 ENCOUNTER — Other Ambulatory Visit: Payer: Self-pay

## 2020-08-07 ENCOUNTER — Other Ambulatory Visit: Payer: Self-pay | Admitting: Hematology and Oncology

## 2020-08-07 DIAGNOSIS — N6489 Other specified disorders of breast: Secondary | ICD-10-CM

## 2020-08-07 DIAGNOSIS — R928 Other abnormal and inconclusive findings on diagnostic imaging of breast: Secondary | ICD-10-CM | POA: Diagnosis not present

## 2020-08-28 MED FILL — OZEMPIC (1 MG/DOSE) 4 MG/3M: 4 | 28 days supply | Qty: 3 | Fill #2

## 2020-08-28 MED FILL — SIMVASTATIN 40 MG TABLET: 40 | 90 days supply | Qty: 90 | Fill #1

## 2020-08-28 MED FILL — ESCITALOPRAM 10 MG TABLET: 10 | 90 days supply | Qty: 135 | Fill #1

## 2020-08-28 MED FILL — VALSARTAN-HCTZ 160-25 MG TA: 160-25 | 90 days supply | Qty: 90 | Fill #1

## 2020-08-28 MED FILL — METFORMIN HCL 1000 MG TABS: 1000 | 90 days supply | Qty: 180 | Fill #1

## 2020-09-18 MED FILL — TOPIRAMATE 50 MG TABLET: 50 | 90 days supply | Qty: 135 | Fill #1

## 2020-09-20 MED FILL — OZEMPIC (1 MG/DOSE) 4 MG/3M: 4 | 84 days supply | Qty: 9 | Fill #3

## 2020-10-04 DIAGNOSIS — R509 Fever, unspecified: Secondary | ICD-10-CM | POA: Diagnosis not present

## 2020-10-04 DIAGNOSIS — J029 Acute pharyngitis, unspecified: Secondary | ICD-10-CM | POA: Diagnosis not present

## 2020-11-03 ENCOUNTER — Other Ambulatory Visit (HOSPITAL_COMMUNITY): Payer: Self-pay | Admitting: Family Medicine

## 2020-11-03 DIAGNOSIS — E1169 Type 2 diabetes mellitus with other specified complication: Secondary | ICD-10-CM | POA: Diagnosis not present

## 2020-11-03 DIAGNOSIS — E785 Hyperlipidemia, unspecified: Secondary | ICD-10-CM | POA: Diagnosis not present

## 2020-11-03 DIAGNOSIS — F411 Generalized anxiety disorder: Secondary | ICD-10-CM | POA: Diagnosis not present

## 2020-11-03 DIAGNOSIS — E559 Vitamin D deficiency, unspecified: Secondary | ICD-10-CM | POA: Diagnosis not present

## 2020-11-03 DIAGNOSIS — Z0000 Encounter for general adult medical examination without abnormal findings: Secondary | ICD-10-CM | POA: Diagnosis not present

## 2020-11-03 DIAGNOSIS — I152 Hypertension secondary to endocrine disorders: Secondary | ICD-10-CM | POA: Diagnosis not present

## 2020-11-03 DIAGNOSIS — Z6835 Body mass index (BMI) 35.0-35.9, adult: Secondary | ICD-10-CM | POA: Diagnosis not present

## 2020-11-03 DIAGNOSIS — E1159 Type 2 diabetes mellitus with other circulatory complications: Secondary | ICD-10-CM | POA: Diagnosis not present

## 2020-11-03 MED FILL — ALPRAZolam 0.25 MG TABS: 0.25 | 30 days supply | Qty: 30 | Fill #0

## 2020-11-10 ENCOUNTER — Other Ambulatory Visit (HOSPITAL_COMMUNITY): Payer: Self-pay | Admitting: Family Medicine

## 2020-11-10 MED FILL — SIMVASTATIN 40 MG TABLET: 40 | 90 days supply | Qty: 90 | Fill #0

## 2020-11-14 MED FILL — VALSARTAN-HCTZ 160-25 MG TA: 160-25 | 90 days supply | Qty: 90 | Fill #0

## 2020-11-14 MED FILL — METFORMIN HCL 1000 MG TABS: 1000 | 90 days supply | Qty: 180 | Fill #0

## 2020-11-14 MED FILL — ESCITALOPRAM 10 MG TABLET: 10 | 90 days supply | Qty: 135 | Fill #0

## 2020-11-16 ENCOUNTER — Other Ambulatory Visit (HOSPITAL_COMMUNITY): Payer: Self-pay | Admitting: Family Medicine

## 2020-11-17 MED FILL — TRETINOIN 0.05 % CREA: 0.05 | 30 days supply | Qty: 45 | Fill #0

## 2020-12-06 ENCOUNTER — Other Ambulatory Visit (HOSPITAL_BASED_OUTPATIENT_CLINIC_OR_DEPARTMENT_OTHER): Payer: Self-pay

## 2020-12-13 MED FILL — OZEMPIC (1 MG/DOSE) 4 MG/3M: 4 | 84 days supply | Qty: 9 | Fill #0

## 2020-12-14 ENCOUNTER — Other Ambulatory Visit (HOSPITAL_COMMUNITY): Payer: Self-pay | Admitting: Family Medicine

## 2021-01-02 ENCOUNTER — Other Ambulatory Visit (HOSPITAL_COMMUNITY): Payer: Self-pay

## 2021-01-02 ENCOUNTER — Other Ambulatory Visit (HOSPITAL_COMMUNITY): Payer: Self-pay | Admitting: Family Medicine

## 2021-01-03 ENCOUNTER — Other Ambulatory Visit (HOSPITAL_COMMUNITY): Payer: Self-pay

## 2021-01-08 ENCOUNTER — Other Ambulatory Visit (HOSPITAL_COMMUNITY): Payer: Self-pay | Admitting: Family Medicine

## 2021-01-08 ENCOUNTER — Other Ambulatory Visit (HOSPITAL_COMMUNITY): Payer: Self-pay

## 2021-01-09 ENCOUNTER — Other Ambulatory Visit (HOSPITAL_COMMUNITY): Payer: Self-pay

## 2021-01-09 MED ORDER — TOPIRAMATE 50 MG PO TABS
ORAL_TABLET | ORAL | 1 refills | Status: DC
  Filled 2021-01-09: qty 135, 90d supply, fill #0
  Filled 2021-04-05: qty 135, 90d supply, fill #1

## 2021-01-10 ENCOUNTER — Other Ambulatory Visit (HOSPITAL_COMMUNITY): Payer: Self-pay

## 2021-01-18 ENCOUNTER — Other Ambulatory Visit (HOSPITAL_COMMUNITY): Payer: Self-pay

## 2021-01-23 ENCOUNTER — Other Ambulatory Visit: Payer: Self-pay | Admitting: Family Medicine

## 2021-01-24 DIAGNOSIS — Z20822 Contact with and (suspected) exposure to covid-19: Secondary | ICD-10-CM | POA: Diagnosis not present

## 2021-01-25 ENCOUNTER — Other Ambulatory Visit: Payer: Self-pay | Admitting: Family Medicine

## 2021-02-02 DIAGNOSIS — L820 Inflamed seborrheic keratosis: Secondary | ICD-10-CM | POA: Diagnosis not present

## 2021-02-02 DIAGNOSIS — D485 Neoplasm of uncertain behavior of skin: Secondary | ICD-10-CM | POA: Diagnosis not present

## 2021-02-02 DIAGNOSIS — D225 Melanocytic nevi of trunk: Secondary | ICD-10-CM | POA: Diagnosis not present

## 2021-02-02 DIAGNOSIS — D2271 Melanocytic nevi of right lower limb, including hip: Secondary | ICD-10-CM | POA: Diagnosis not present

## 2021-02-02 DIAGNOSIS — D2272 Melanocytic nevi of left lower limb, including hip: Secondary | ICD-10-CM | POA: Diagnosis not present

## 2021-02-02 DIAGNOSIS — Z85828 Personal history of other malignant neoplasm of skin: Secondary | ICD-10-CM | POA: Diagnosis not present

## 2021-02-02 DIAGNOSIS — D2261 Melanocytic nevi of right upper limb, including shoulder: Secondary | ICD-10-CM | POA: Diagnosis not present

## 2021-02-02 DIAGNOSIS — D2371 Other benign neoplasm of skin of right lower limb, including hip: Secondary | ICD-10-CM | POA: Diagnosis not present

## 2021-02-02 DIAGNOSIS — L720 Epidermal cyst: Secondary | ICD-10-CM | POA: Diagnosis not present

## 2021-02-02 DIAGNOSIS — L821 Other seborrheic keratosis: Secondary | ICD-10-CM | POA: Diagnosis not present

## 2021-03-01 ENCOUNTER — Other Ambulatory Visit: Payer: Self-pay

## 2021-03-01 ENCOUNTER — Ambulatory Visit
Admission: RE | Admit: 2021-03-01 | Discharge: 2021-03-01 | Disposition: A | Discharge status: Home / Self Care | Payer: 59 | Source: Ambulatory Visit | Attending: Hematology and Oncology | Admitting: Hematology and Oncology

## 2021-03-01 DIAGNOSIS — R928 Other abnormal and inconclusive findings on diagnostic imaging of breast: Secondary | ICD-10-CM | POA: Diagnosis not present

## 2021-03-01 DIAGNOSIS — N6489 Other specified disorders of breast: Secondary | ICD-10-CM

## 2021-03-20 ENCOUNTER — Other Ambulatory Visit (HOSPITAL_COMMUNITY): Payer: Self-pay

## 2021-03-20 MED FILL — Simvastatin Tab 40 MG: ORAL | 90 days supply | Qty: 90 | Fill #0 | Status: AC

## 2021-03-20 MED FILL — Escitalopram Oxalate Tab 10 MG (Base Equiv): ORAL | 90 days supply | Qty: 135 | Fill #0 | Status: AC

## 2021-03-20 MED FILL — Valsartan-Hydrochlorothiazide Tab 160-25 MG: ORAL | 90 days supply | Qty: 90 | Fill #0 | Status: AC

## 2021-03-20 MED FILL — Metformin HCl Tab 1000 MG: ORAL | 90 days supply | Qty: 180 | Fill #0 | Status: AC

## 2021-04-02 ENCOUNTER — Other Ambulatory Visit (HOSPITAL_COMMUNITY): Payer: Self-pay

## 2021-04-02 DIAGNOSIS — F411 Generalized anxiety disorder: Secondary | ICD-10-CM | POA: Diagnosis not present

## 2021-04-02 DIAGNOSIS — E1169 Type 2 diabetes mellitus with other specified complication: Secondary | ICD-10-CM | POA: Diagnosis not present

## 2021-04-02 DIAGNOSIS — F5081 Binge eating disorder: Secondary | ICD-10-CM | POA: Diagnosis not present

## 2021-04-02 DIAGNOSIS — Z6835 Body mass index (BMI) 35.0-35.9, adult: Secondary | ICD-10-CM | POA: Diagnosis not present

## 2021-04-02 MED ORDER — WEGOVY 1.7 MG/0.75ML ~~LOC~~ SOAJ
1.7000 mg | SUBCUTANEOUS | 5 refills | Status: AC
  Filled 2021-04-02: qty 3, 28d supply, fill #0
  Filled 2021-05-11: qty 3, 28d supply, fill #1

## 2021-04-03 ENCOUNTER — Other Ambulatory Visit (HOSPITAL_COMMUNITY): Payer: Self-pay

## 2021-04-04 ENCOUNTER — Other Ambulatory Visit (HOSPITAL_COMMUNITY): Payer: Self-pay

## 2021-04-05 ENCOUNTER — Other Ambulatory Visit (HOSPITAL_COMMUNITY): Payer: Self-pay

## 2021-04-09 ENCOUNTER — Other Ambulatory Visit (HOSPITAL_COMMUNITY): Payer: Self-pay

## 2021-04-10 ENCOUNTER — Other Ambulatory Visit (HOSPITAL_COMMUNITY): Payer: Self-pay

## 2021-04-25 IMAGING — MG DIGITAL SCREENING BILAT W/ TOMO W/ CAD
6 of 10 series · 6 of 30 positions shown · non-contrast
Comparison: Previous exam(s).

CLINICAL DATA: Screening.

EXAM:
DIGITAL SCREENING BILATERAL MAMMOGRAM WITH TOMO AND CAD

[L MLO synth-2D]
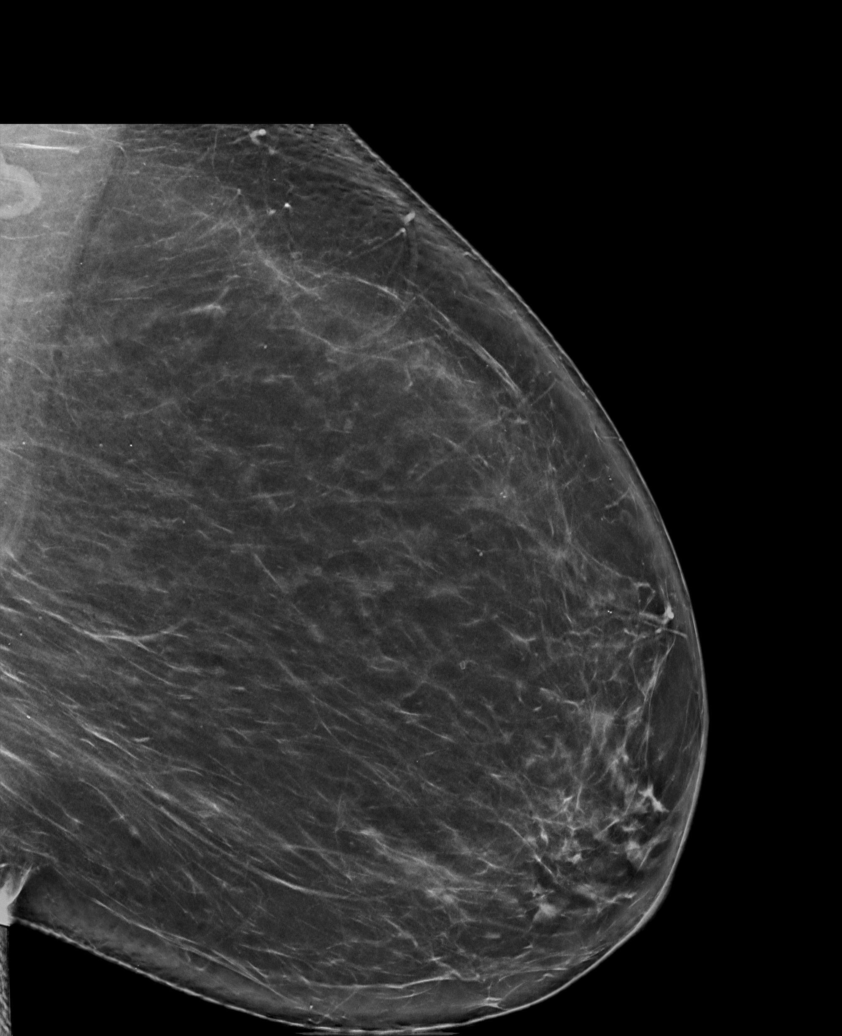

[R CC synth-2D]
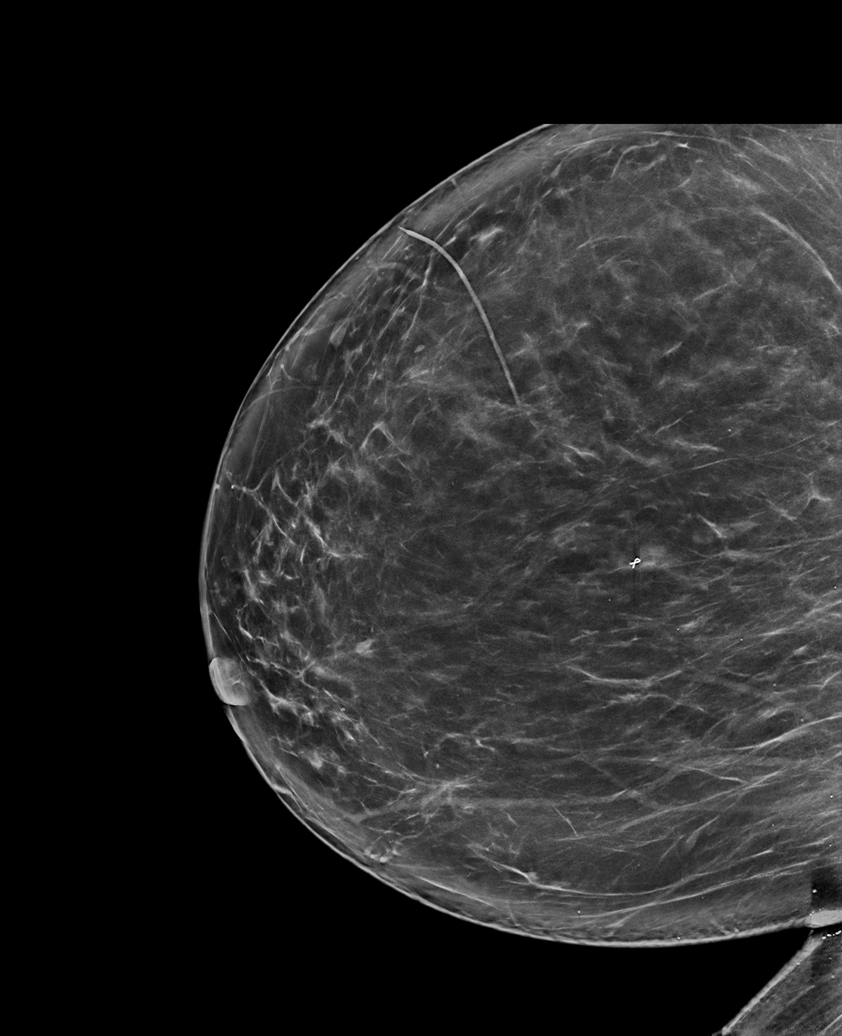

[R MLO synth-2D]
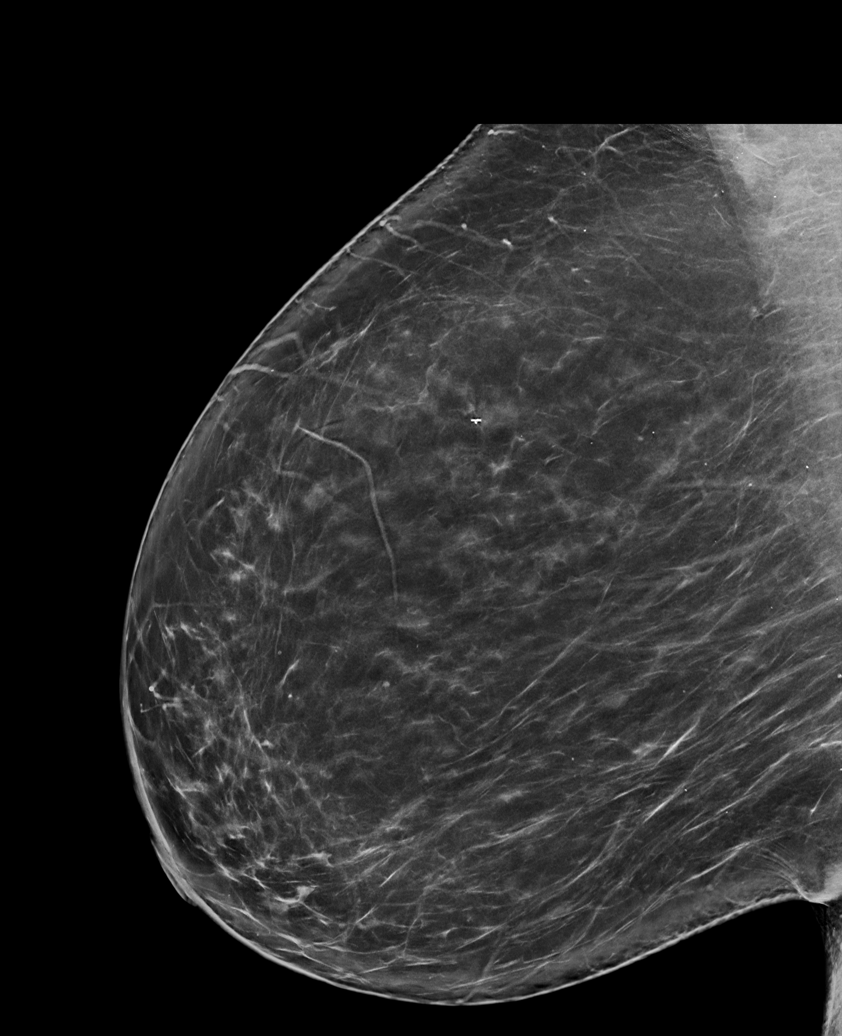

[L CC synth-2D (1 of 2)]
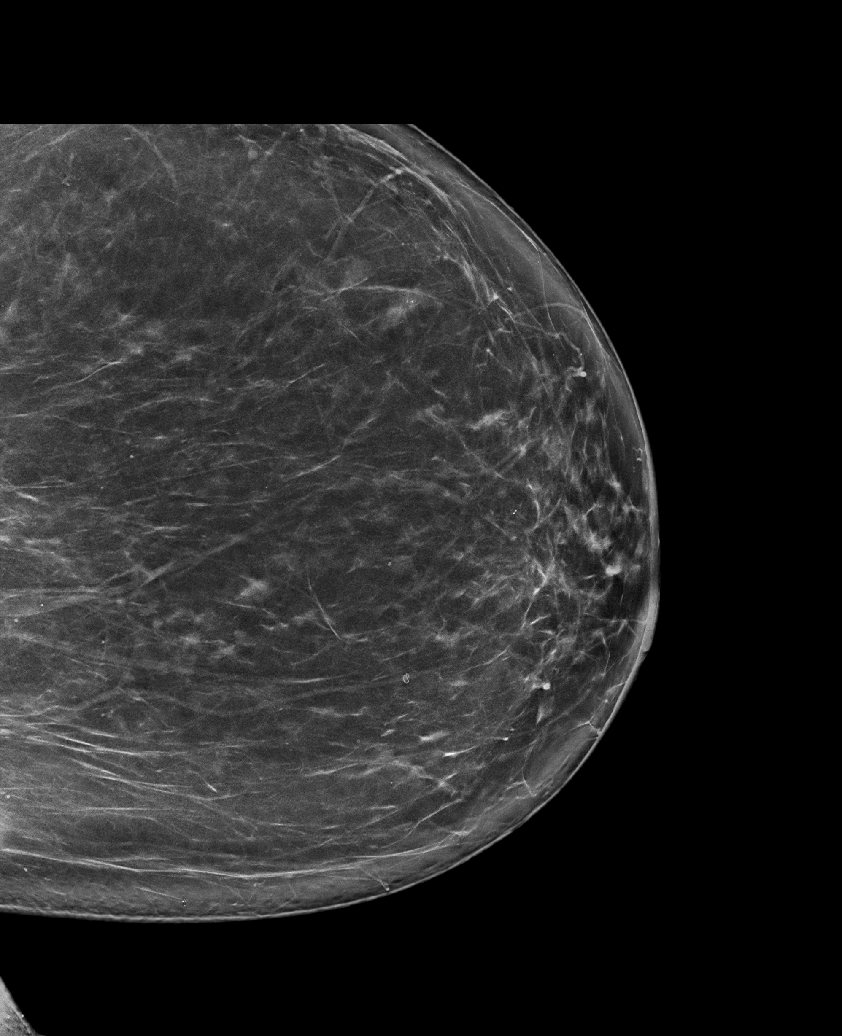

[L CC synth-2D (2 of 2)]
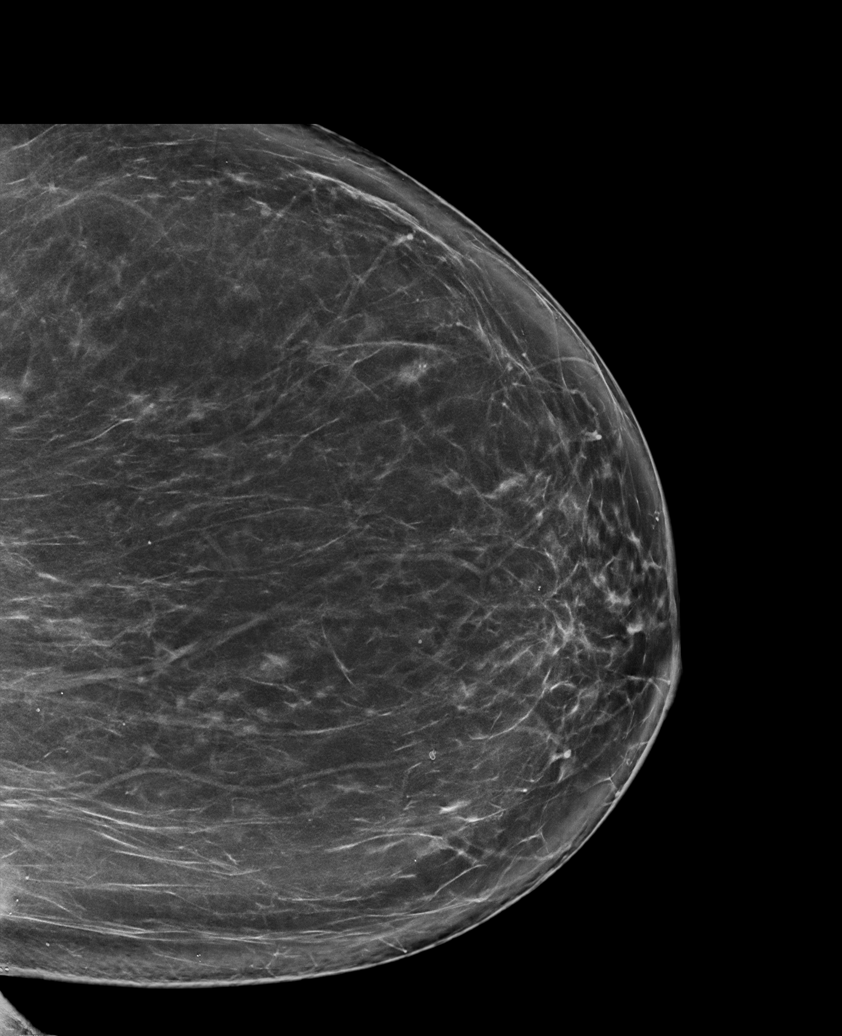

[L CC tomo · tomo slice 45/89.0]
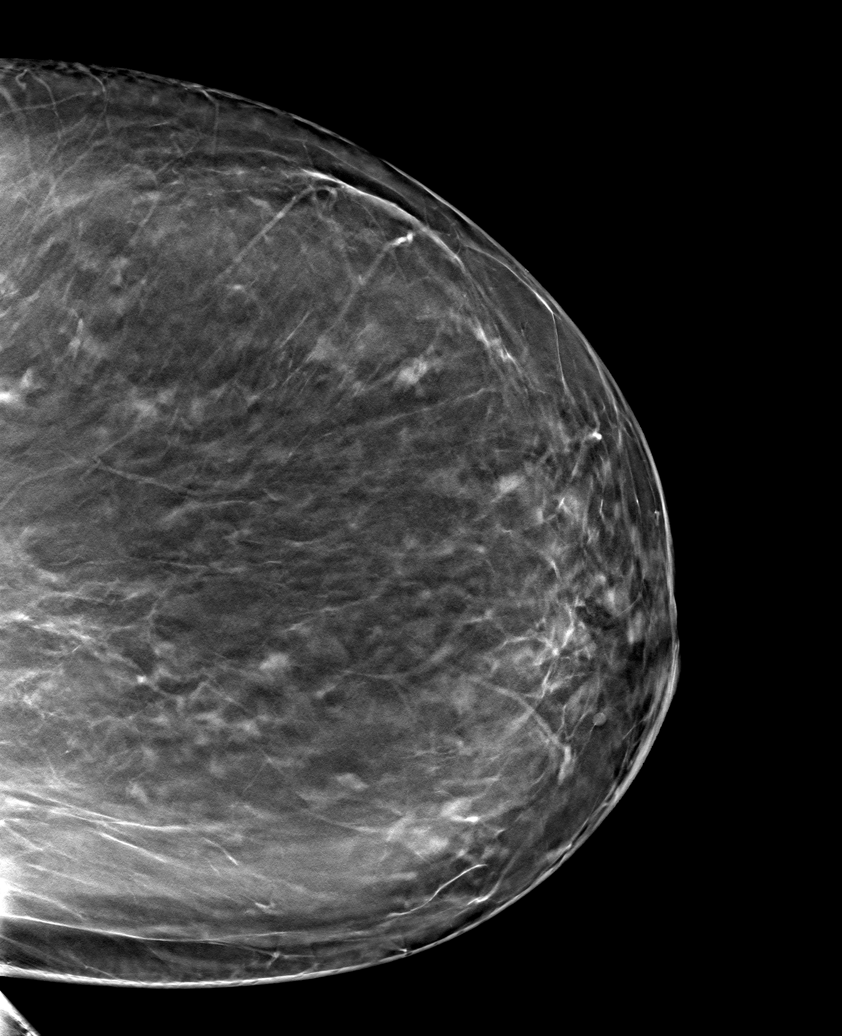

[6 of 30 positions shown; findings below may reference images not displayed]

ACR Breast Density Category b: There are scattered areas of
fibroglandular density.
FINDINGS: In the left breast, a possible focal asymmetry warrants further
evaluation. In the right breast, no findings suspicious for
malignancy. Images were processed with CAD.
IMPRESSION: Further evaluation is suggested for possible asymmetry in the left
breast.

RECOMMENDATION:
Diagnostic mammogram and possibly ultrasound of the left breast.
(Code:P8-7-ZZC)

The patient will be contacted regarding the findings, and additional
imaging will be scheduled.

BI-RADS CATEGORY  0: Incomplete. Need additional imaging evaluation
and/or prior mammograms for comparison.

## 2021-05-11 ENCOUNTER — Other Ambulatory Visit (HOSPITAL_COMMUNITY): Payer: Self-pay

## 2021-05-14 DIAGNOSIS — F5081 Binge eating disorder: Secondary | ICD-10-CM | POA: Diagnosis not present

## 2021-05-14 DIAGNOSIS — E1169 Type 2 diabetes mellitus with other specified complication: Secondary | ICD-10-CM | POA: Diagnosis not present

## 2021-05-14 DIAGNOSIS — E538 Deficiency of other specified B group vitamins: Secondary | ICD-10-CM | POA: Diagnosis not present

## 2021-05-14 DIAGNOSIS — Z8639 Personal history of other endocrine, nutritional and metabolic disease: Secondary | ICD-10-CM | POA: Diagnosis not present

## 2021-05-14 DIAGNOSIS — Z6835 Body mass index (BMI) 35.0-35.9, adult: Secondary | ICD-10-CM | POA: Diagnosis not present

## 2021-07-11 ENCOUNTER — Other Ambulatory Visit (HOSPITAL_COMMUNITY): Payer: Self-pay

## 2021-07-11 ENCOUNTER — Other Ambulatory Visit (HOSPITAL_COMMUNITY): Payer: Self-pay | Admitting: Family Medicine

## 2021-07-11 MED ORDER — TOPIRAMATE 50 MG PO TABS
ORAL_TABLET | ORAL | 1 refills | Status: AC
  Filled 2021-07-11: qty 135, 90d supply, fill #0

## 2021-07-16 DIAGNOSIS — Z6834 Body mass index (BMI) 34.0-34.9, adult: Secondary | ICD-10-CM | POA: Diagnosis not present

## 2021-07-16 DIAGNOSIS — Z01419 Encounter for gynecological examination (general) (routine) without abnormal findings: Secondary | ICD-10-CM | POA: Diagnosis not present

## 2021-07-17 ENCOUNTER — Other Ambulatory Visit (HOSPITAL_COMMUNITY): Payer: Self-pay

## 2021-07-18 ENCOUNTER — Other Ambulatory Visit (HOSPITAL_COMMUNITY): Payer: Self-pay

## 2021-07-18 MED ORDER — ESCITALOPRAM OXALATE 10 MG PO TABS
15.0000 mg | ORAL_TABLET | Freq: Every day | ORAL | 1 refills | Status: DC
  Filled 2021-07-18: qty 135, 90d supply, fill #0
  Filled 2021-10-11: qty 135, 90d supply, fill #1

## 2021-07-18 MED ORDER — VALSARTAN-HYDROCHLOROTHIAZIDE 160-25 MG PO TABS
1.0000 | ORAL_TABLET | Freq: Every day | ORAL | 1 refills | Status: DC
  Filled 2021-07-18: qty 90, 90d supply, fill #0
  Filled 2021-10-11: qty 90, 90d supply, fill #1

## 2021-07-18 MED ORDER — SIMVASTATIN 40 MG PO TABS
40.0000 mg | ORAL_TABLET | Freq: Every day | ORAL | 1 refills | Status: DC
  Filled 2021-07-18: qty 90, 90d supply, fill #0
  Filled 2021-10-11: qty 90, 90d supply, fill #1

## 2021-07-18 MED ORDER — METFORMIN HCL 1000 MG PO TABS
1000.0000 mg | ORAL_TABLET | Freq: Two times a day (BID) | ORAL | 1 refills | Status: DC
  Filled 2021-07-18: qty 180, 90d supply, fill #0
  Filled 2021-10-11: qty 180, 90d supply, fill #1

## 2021-07-19 ENCOUNTER — Other Ambulatory Visit (HOSPITAL_COMMUNITY): Payer: Self-pay

## 2021-07-23 DIAGNOSIS — M79602 Pain in left arm: Secondary | ICD-10-CM | POA: Diagnosis not present

## 2021-07-23 DIAGNOSIS — M79601 Pain in right arm: Secondary | ICD-10-CM | POA: Diagnosis not present

## 2021-08-24 DIAGNOSIS — M25512 Pain in left shoulder: Secondary | ICD-10-CM | POA: Diagnosis not present

## 2021-09-25 ENCOUNTER — Other Ambulatory Visit (HOSPITAL_COMMUNITY): Payer: Self-pay

## 2021-10-11 ENCOUNTER — Other Ambulatory Visit (HOSPITAL_COMMUNITY): Payer: Self-pay

## 2021-10-15 ENCOUNTER — Other Ambulatory Visit (HOSPITAL_COMMUNITY): Payer: Self-pay

## 2021-10-15 MED FILL — Tretinoin Microsphere Gel 0.04%: CUTANEOUS | 1 days supply | Qty: 45 | Fill #0 | Status: CN

## 2021-10-18 ENCOUNTER — Other Ambulatory Visit (HOSPITAL_COMMUNITY): Payer: Self-pay

## 2021-10-25 ENCOUNTER — Other Ambulatory Visit (HOSPITAL_COMMUNITY): Payer: Self-pay

## 2021-10-29 ENCOUNTER — Other Ambulatory Visit (HOSPITAL_COMMUNITY): Payer: Self-pay

## 2021-10-29 DIAGNOSIS — M79602 Pain in left arm: Secondary | ICD-10-CM | POA: Diagnosis not present

## 2021-10-29 DIAGNOSIS — M79601 Pain in right arm: Secondary | ICD-10-CM | POA: Diagnosis not present

## 2021-10-30 ENCOUNTER — Other Ambulatory Visit (HOSPITAL_COMMUNITY): Payer: Self-pay

## 2021-10-30 MED ORDER — MOUNJARO 12.5 MG/0.5ML ~~LOC~~ SOAJ
12.5000 mg | SUBCUTANEOUS | 0 refills | Status: AC
  Filled 2021-10-30: qty 2, 28d supply, fill #0

## 2021-11-01 ENCOUNTER — Other Ambulatory Visit (HOSPITAL_COMMUNITY): Payer: Self-pay

## 2021-11-02 ENCOUNTER — Other Ambulatory Visit (HOSPITAL_COMMUNITY): Payer: Self-pay

## 2021-11-28 ENCOUNTER — Other Ambulatory Visit (HOSPITAL_COMMUNITY): Payer: Self-pay

## 2021-11-29 ENCOUNTER — Other Ambulatory Visit (HOSPITAL_COMMUNITY): Payer: Self-pay

## 2021-11-29 MED ORDER — VALSARTAN-HYDROCHLOROTHIAZIDE 160-25 MG PO TABS
1.0000 | ORAL_TABLET | Freq: Every day | ORAL | 0 refills | Status: DC
  Filled 2021-11-29: qty 90, 90d supply, fill #0

## 2021-11-29 MED ORDER — SEMAGLUTIDE-WEIGHT MANAGEMENT 2.4 MG/0.75ML ~~LOC~~ SOAJ
SUBCUTANEOUS | 5 refills | Status: AC
  Filled 2022-01-28 – 2022-04-17 (×2): qty 3, 28d supply, fill #0

## 2021-11-29 MED ORDER — ESCITALOPRAM OXALATE 10 MG PO TABS
15.0000 mg | ORAL_TABLET | Freq: Every day | ORAL | 0 refills | Status: DC
  Filled 2021-11-29: qty 135, 90d supply, fill #0

## 2021-11-29 MED ORDER — SIMVASTATIN 40 MG PO TABS
40.0000 mg | ORAL_TABLET | Freq: Every day | ORAL | 0 refills | Status: DC
  Filled 2021-11-29: qty 90, 90d supply, fill #0

## 2021-11-29 MED ORDER — MOUNJARO 15 MG/0.5ML ~~LOC~~ SOAJ
SUBCUTANEOUS | 5 refills | Status: DC
  Filled 2021-11-29: qty 2, 28d supply, fill #0
  Filled 2021-12-21: qty 2, 28d supply, fill #1
  Filled 2022-01-29: qty 2, 28d supply, fill #2
  Filled 2022-04-17: qty 2, 28d supply, fill #3
  Filled 2022-05-24: qty 2, 28d supply, fill #4

## 2021-11-29 MED ORDER — METFORMIN HCL 1000 MG PO TABS
1000.0000 mg | ORAL_TABLET | Freq: Two times a day (BID) | ORAL | 0 refills | Status: DC
  Filled 2021-11-29: qty 180, 90d supply, fill #0

## 2021-11-30 ENCOUNTER — Other Ambulatory Visit (HOSPITAL_COMMUNITY): Payer: Self-pay

## 2021-12-03 ENCOUNTER — Other Ambulatory Visit (HOSPITAL_COMMUNITY): Payer: Self-pay

## 2021-12-11 DIAGNOSIS — M79601 Pain in right arm: Secondary | ICD-10-CM | POA: Diagnosis not present

## 2021-12-11 DIAGNOSIS — M79602 Pain in left arm: Secondary | ICD-10-CM | POA: Diagnosis not present

## 2021-12-21 ENCOUNTER — Other Ambulatory Visit (HOSPITAL_COMMUNITY): Payer: Self-pay

## 2021-12-22 ENCOUNTER — Other Ambulatory Visit (HOSPITAL_COMMUNITY): Payer: Self-pay

## 2021-12-31 DIAGNOSIS — M79601 Pain in right arm: Secondary | ICD-10-CM | POA: Diagnosis not present

## 2021-12-31 DIAGNOSIS — M79602 Pain in left arm: Secondary | ICD-10-CM | POA: Diagnosis not present

## 2022-01-07 DIAGNOSIS — M79602 Pain in left arm: Secondary | ICD-10-CM | POA: Diagnosis not present

## 2022-01-07 DIAGNOSIS — M79601 Pain in right arm: Secondary | ICD-10-CM | POA: Diagnosis not present

## 2022-01-11 DIAGNOSIS — M25612 Stiffness of left shoulder, not elsewhere classified: Secondary | ICD-10-CM | POA: Diagnosis not present

## 2022-01-11 DIAGNOSIS — M25512 Pain in left shoulder: Secondary | ICD-10-CM | POA: Diagnosis not present

## 2022-01-28 ENCOUNTER — Other Ambulatory Visit (HOSPITAL_COMMUNITY): Payer: Self-pay | Admitting: Family Medicine

## 2022-01-28 ENCOUNTER — Other Ambulatory Visit (HOSPITAL_COMMUNITY): Payer: Self-pay

## 2022-01-29 ENCOUNTER — Other Ambulatory Visit (HOSPITAL_COMMUNITY): Payer: Self-pay

## 2022-01-30 ENCOUNTER — Other Ambulatory Visit: Payer: Self-pay | Admitting: Nurse Practitioner

## 2022-01-30 DIAGNOSIS — G8929 Other chronic pain: Secondary | ICD-10-CM

## 2022-02-02 ENCOUNTER — Ambulatory Visit (INDEPENDENT_AMBULATORY_CARE_PROVIDER_SITE_OTHER): Payer: 59

## 2022-02-02 DIAGNOSIS — G8929 Other chronic pain: Secondary | ICD-10-CM

## 2022-02-02 DIAGNOSIS — M25512 Pain in left shoulder: Secondary | ICD-10-CM

## 2022-02-02 DIAGNOSIS — M19012 Primary osteoarthritis, left shoulder: Secondary | ICD-10-CM | POA: Diagnosis not present

## 2022-02-05 DIAGNOSIS — L708 Other acne: Secondary | ICD-10-CM | POA: Diagnosis not present

## 2022-02-05 DIAGNOSIS — I152 Hypertension secondary to endocrine disorders: Secondary | ICD-10-CM | POA: Diagnosis not present

## 2022-02-05 DIAGNOSIS — E785 Hyperlipidemia, unspecified: Secondary | ICD-10-CM | POA: Diagnosis not present

## 2022-02-05 DIAGNOSIS — F411 Generalized anxiety disorder: Secondary | ICD-10-CM | POA: Diagnosis not present

## 2022-02-05 DIAGNOSIS — E1169 Type 2 diabetes mellitus with other specified complication: Secondary | ICD-10-CM | POA: Diagnosis not present

## 2022-02-05 DIAGNOSIS — Z9109 Other allergy status, other than to drugs and biological substances: Secondary | ICD-10-CM | POA: Diagnosis not present

## 2022-02-05 DIAGNOSIS — M25512 Pain in left shoulder: Secondary | ICD-10-CM | POA: Diagnosis not present

## 2022-02-05 DIAGNOSIS — E6609 Other obesity due to excess calories: Secondary | ICD-10-CM | POA: Diagnosis not present

## 2022-02-05 DIAGNOSIS — E1159 Type 2 diabetes mellitus with other circulatory complications: Secondary | ICD-10-CM | POA: Diagnosis not present

## 2022-02-05 DIAGNOSIS — Z0000 Encounter for general adult medical examination without abnormal findings: Secondary | ICD-10-CM | POA: Diagnosis not present

## 2022-02-12 ENCOUNTER — Other Ambulatory Visit: Payer: Self-pay | Admitting: Hematology and Oncology

## 2022-02-12 DIAGNOSIS — Z1231 Encounter for screening mammogram for malignant neoplasm of breast: Secondary | ICD-10-CM

## 2022-02-12 DIAGNOSIS — C44519 Basal cell carcinoma of skin of other part of trunk: Secondary | ICD-10-CM | POA: Diagnosis not present

## 2022-02-12 DIAGNOSIS — L820 Inflamed seborrheic keratosis: Secondary | ICD-10-CM | POA: Diagnosis not present

## 2022-02-12 DIAGNOSIS — L821 Other seborrheic keratosis: Secondary | ICD-10-CM | POA: Diagnosis not present

## 2022-02-12 DIAGNOSIS — Z85828 Personal history of other malignant neoplasm of skin: Secondary | ICD-10-CM | POA: Diagnosis not present

## 2022-02-20 ENCOUNTER — Ambulatory Visit: Payer: Self-pay

## 2022-02-20 ENCOUNTER — Ambulatory Visit: Payer: 59 | Admitting: Orthopaedic Surgery

## 2022-02-20 ENCOUNTER — Encounter: Payer: Self-pay | Admitting: Orthopedic Surgery

## 2022-02-20 ENCOUNTER — Ambulatory Visit: Payer: 59 | Admitting: Orthopedic Surgery

## 2022-02-20 DIAGNOSIS — M25512 Pain in left shoulder: Secondary | ICD-10-CM | POA: Diagnosis not present

## 2022-02-20 DIAGNOSIS — M19012 Primary osteoarthritis, left shoulder: Secondary | ICD-10-CM

## 2022-02-20 MED ORDER — BUPIVACAINE HCL 0.25 % IJ SOLN
0.6600 mL | INTRAMUSCULAR | Status: AC | PRN
  Administered 2022-02-20: .66 mL via INTRA_ARTICULAR

## 2022-02-20 MED ORDER — LIDOCAINE HCL 1 % IJ SOLN
3.0000 mL | INTRAMUSCULAR | Status: AC | PRN
  Administered 2022-02-20: 3 mL

## 2022-02-20 MED ORDER — METHYLPREDNISOLONE ACETATE 40 MG/ML IJ SUSP
13.3300 mg | INTRAMUSCULAR | Status: AC | PRN
  Administered 2022-02-20: 13.33 mg via INTRA_ARTICULAR

## 2022-02-20 NOTE — Progress Notes (Signed)
Office Visit Note   Patient: Rhonda Garza           Date of Birth: 06-16-1966           MRN: [id number] Visit Date: 02/20/2022 Requested by: Jeri Cos, MD [place],  [place] [id number] PCP: Jeri Cos, MD  Subjective: Chief Complaint  Patient presents with   Left Shoulder - Pain    HPI: Rhonda Garza is a 56 year old patient with left shoulder pain of 8 months duration.  Reports relatively constant pain until she was prescribed Flexeril and this has helped.  Mobic also helped.  She reports pain when she tries to get behind her back such as to fasten her bra.  Does wake her from sleep at night.  Denies any neck pain or radicular symptoms.  She had a subacromial injection in April without much relief.  Has not had any physical therapy yet.  No prior shoulder surgeries.  She is right-hand dominant.  Does have difficulty with ADLs.              ROS: All systems reviewed are negative as they relate to the chief complaint within the history of present illness.  Patient denies  fevers or chills.   Assessment & Plan: Visit Diagnoses:  1. Left shoulder pain, unspecified chronicity     Plan: Impression is left shoulder pain which on MRI scan looks to have a component of at least [facility] joint edema and arthritis present.  AC joint injected today under ultrasound guidance for diagnostic and therapeutic purposes.  This may also be coming from intra-articular mild glenohumeral joint arthritis versus early frozen shoulder although her external rotation and forward flexion is generally symmetric on both sides.  Neck step would be a 3-week return for possible glenohumeral injection at that time under ultrasound guidance.  For now I would like her to see how this injection does for [facility] joint pain relief with attempt to return to normal activities next week.  Follow-up in 3 weeks for clinical recheck  Follow-Up Instructions: Return in about 3 weeks (around 03/13/2022).    Orders:  Orders Placed This Encounter  Procedures   US Guided Needle Placement - No Linked Charges   No orders of the defined types were placed in this encounter.     Procedures: Medium Joint Inj: L acromioclavicular on 02/20/2022 11:31 PM Indications: diagnostic evaluation and pain Details: 25 G 1.5 in needle, ultrasound-guided superior approach Medications: 3 mL lidocaine 1 %; 0.66 mL bupivacaine 0.25 %; 13.33 mg methylPREDNISolone acetate 40 MG/ML Outcome: tolerated well, no immediate complications Procedure, treatment alternatives, risks and benefits explained, specific risks discussed. Consent was given by the patient. Immediately prior to procedure a time out was called to verify the correct patient, procedure, equipment, support staff and site/side marked as required. Patient was prepped and draped in the usual sterile fashion.      Clinical Data: No additional findings.  Objective: Vital Signs: There were no vitals taken for this visit.  Physical Exam:   Constitutional: Patient appears well-developed HEENT:  Head: Normocephalic Eyes:EOM are normal Neck: Normal range of motion Cardiovascular: Normal rate Pulmonary/chest: Effort normal Neurologic: Patient is alert Skin: Skin is warm Psychiatric: Patient has normal mood and affect   Ortho Exam: Ortho exam demonstrates full cervical spine range of motion with 5 out of 5 grip EPL FPL interosseous wrist flexion extension bicep triceps and deltoid strength.  No definite paresthesias C5-T1.  Radial pulse intact bilaterally.  Passive range of motion of both shoulders is 60/85/160.  Rotator cuff strength intact infraspinatus supraspinatus and subscap muscle testing bilaterally.  Does have more AC joint tenderness on the left than the right to discrete palpation along with crossarm adduction.  Negative apprehension relocation testing.  No coarse grinding or crepitus with internal/external rotation of the right or left arm at 90  degrees of abduction.  Specialty Comments:  No specialty comments available.  Imaging: US Guided Needle Placement - No Linked Charges  Result Date: 02/20/2022 Ultrasound imaging demonstrates needle placement into the [facility] joint with extravasation of fluid and no complicating features.    PMFS History: Patient Active Problem List   Diagnosis Date Noted   Vitamin D deficiency 06/05/2020   Hyperlipidemia associated with type 2 diabetes mellitus ([place]) 04/24/2020   Hypertension associated with type 2 diabetes mellitus ([place]) 04/24/2020   History of basal cell cancer 03/02/2019   Cervical high risk HPV (human papillomavirus) test positive 02/26/2018   Binge eating disorder 02/26/2018   Hyperlipidemia 12/11/2016   Obesity 12/11/2016   Hypertension 12/11/2016   Snoring 12/05/2016   Type 2 diabetes mellitus without complication, without long-term current use of insulin ([place]) 09/06/2016   Diabetes mellitus type II, controlled ([place]) 09/06/2016   Family history of malignant melanoma 03/31/2014   Generalized anxiety disorder 07/30/2011   Past Medical History:  Diagnosis Date   Anxiety    Back pain    Blurry vision    Constipation    Depression    Diabetes mellitus without complication (HCC)    Edema, lower extremity    High cholesterol    Hypertension    Obesity    Obstructive sleep apnea hypopnea, mild 11/2015   Shortness of breath     Family History  Problem Relation Age of Onset   Cancer Mother    Diabetes Mother    Depression Mother    Anxiety disorder Mother    Bipolar disorder Mother    Sleep apnea Mother    Alcoholism Mother    Cancer Father    Diabetes Father    High blood pressure Father    High Cholesterol Father    Cancer Sister     Past Surgical History:  Procedure Laterality Date   BREAST BIOPSY Right 02/26/2007   BREAST CYST EXCISION Right    over 10 yrs ago   BREAST EXCISIONAL BIOPSY Right 1985   CESAREAN SECTION     Social History   Occupational  History   Occupation: pre Warden/ranger: [employer]  Tobacco Use   Smoking status: Former   Smokeless tobacco: Never   Tobacco comments:    Quit 1999  Substance and Sexual Activity   Alcohol use: Yes    Comment: Rare   Drug use: No   Sexual activity: Yes    Birth control/protection: I.U.D.

## 2022-02-26 ENCOUNTER — Ambulatory Visit: Payer: 59 | Admitting: Orthopaedic Surgery

## 2022-03-11 ENCOUNTER — Ambulatory Visit
Admission: RE | Admit: 2022-03-11 | Discharge: 2022-03-11 | Disposition: A | Discharge status: Home / Self Care | Payer: 59 | Source: Ambulatory Visit | Attending: Hematology and Oncology | Admitting: Hematology and Oncology

## 2022-03-11 ENCOUNTER — Other Ambulatory Visit: Payer: Self-pay | Admitting: Hematology and Oncology

## 2022-03-11 DIAGNOSIS — L720 Epidermal cyst: Secondary | ICD-10-CM | POA: Diagnosis not present

## 2022-03-11 DIAGNOSIS — D2272 Melanocytic nevi of left lower limb, including hip: Secondary | ICD-10-CM | POA: Diagnosis not present

## 2022-03-11 DIAGNOSIS — L821 Other seborrheic keratosis: Secondary | ICD-10-CM | POA: Diagnosis not present

## 2022-03-11 DIAGNOSIS — D2271 Melanocytic nevi of right lower limb, including hip: Secondary | ICD-10-CM | POA: Diagnosis not present

## 2022-03-11 DIAGNOSIS — D2371 Other benign neoplasm of skin of right lower limb, including hip: Secondary | ICD-10-CM | POA: Diagnosis not present

## 2022-03-11 DIAGNOSIS — D2239 Melanocytic nevi of other parts of face: Secondary | ICD-10-CM | POA: Diagnosis not present

## 2022-03-11 DIAGNOSIS — N6320 Unspecified lump in the left breast, unspecified quadrant: Secondary | ICD-10-CM

## 2022-03-11 DIAGNOSIS — D2262 Melanocytic nevi of left upper limb, including shoulder: Secondary | ICD-10-CM | POA: Diagnosis not present

## 2022-03-11 DIAGNOSIS — Z85828 Personal history of other malignant neoplasm of skin: Secondary | ICD-10-CM | POA: Diagnosis not present

## 2022-03-11 DIAGNOSIS — Z1231 Encounter for screening mammogram for malignant neoplasm of breast: Secondary | ICD-10-CM

## 2022-03-11 DIAGNOSIS — D2261 Melanocytic nevi of right upper limb, including shoulder: Secondary | ICD-10-CM | POA: Diagnosis not present

## 2022-03-15 ENCOUNTER — Other Ambulatory Visit (HOSPITAL_COMMUNITY): Payer: Self-pay

## 2022-03-25 ENCOUNTER — Other Ambulatory Visit (HOSPITAL_BASED_OUTPATIENT_CLINIC_OR_DEPARTMENT_OTHER): Payer: Self-pay

## 2022-03-25 ENCOUNTER — Ambulatory Visit (INDEPENDENT_AMBULATORY_CARE_PROVIDER_SITE_OTHER): Payer: 59 | Admitting: Orthopedic Surgery

## 2022-03-25 ENCOUNTER — Other Ambulatory Visit (HOSPITAL_COMMUNITY): Payer: Self-pay

## 2022-03-25 ENCOUNTER — Ambulatory Visit (INDEPENDENT_AMBULATORY_CARE_PROVIDER_SITE_OTHER): Payer: 59

## 2022-03-25 ENCOUNTER — Ambulatory Visit: Payer: Self-pay

## 2022-03-25 DIAGNOSIS — M25512 Pain in left shoulder: Secondary | ICD-10-CM

## 2022-03-25 DIAGNOSIS — M5412 Radiculopathy, cervical region: Secondary | ICD-10-CM | POA: Diagnosis not present

## 2022-03-25 DIAGNOSIS — M7502 Adhesive capsulitis of left shoulder: Secondary | ICD-10-CM | POA: Diagnosis not present

## 2022-03-25 MED ORDER — TIZANIDINE HCL 4 MG PO TABS
4.0000 mg | ORAL_TABLET | Freq: Four times a day (QID) | ORAL | 0 refills | Status: DC | PRN
  Filled 2022-03-25: qty 30, 8d supply, fill #0

## 2022-03-25 MED ORDER — TRAMADOL HCL 50 MG PO TABS
50.0000 mg | ORAL_TABLET | Freq: Two times a day (BID) | ORAL | 0 refills | Status: DC | PRN
  Filled 2022-03-25: qty 30, 15d supply, fill #0

## 2022-03-25 MED ORDER — TIZANIDINE HCL 4 MG PO TABS
4.0000 mg | ORAL_TABLET | Freq: Every day | ORAL | 0 refills | Status: DC
  Filled 2022-03-25: qty 30, 30d supply, fill #0

## 2022-03-25 MED ORDER — TIZANIDINE HCL 4 MG PO TABS
4.0000 mg | ORAL_TABLET | Freq: Every day | ORAL | 0 refills | Status: AC
  Filled 2022-03-25: qty 30, 30d supply, fill #0

## 2022-03-25 MED ORDER — MELOXICAM 15 MG PO TABS
15.0000 mg | ORAL_TABLET | Freq: Every day | ORAL | 0 refills | Status: DC
  Filled 2022-03-25: qty 30, 30d supply, fill #0

## 2022-03-25 MED ORDER — MELOXICAM 15 MG PO TABS
15.0000 mg | ORAL_TABLET | Freq: Every day | ORAL | 0 refills | Status: AC
  Filled 2022-03-25: qty 30, 30d supply, fill #0

## 2022-03-25 MED ORDER — TRAMADOL HCL 50 MG PO TABS
50.0000 mg | ORAL_TABLET | Freq: Two times a day (BID) | ORAL | 0 refills | Status: AC | PRN
  Filled 2022-03-25: qty 30, 15d supply, fill #0

## 2022-03-26 ENCOUNTER — Encounter: Payer: Self-pay | Admitting: Orthopedic Surgery

## 2022-03-26 ENCOUNTER — Telehealth: Payer: Self-pay | Admitting: Orthopedic Surgery

## 2022-03-26 MED ORDER — LIDOCAINE HCL 1 % IJ SOLN
5.0000 mL | INTRAMUSCULAR | Status: AC | PRN
  Administered 2022-03-25: 5 mL

## 2022-03-26 MED ORDER — BUPIVACAINE HCL 0.5 % IJ SOLN
9.0000 mL | INTRAMUSCULAR | Status: AC | PRN
  Administered 2022-03-25: 9 mL via INTRA_ARTICULAR

## 2022-03-26 MED ORDER — METHYLPREDNISOLONE ACETATE 40 MG/ML IJ SUSP
40.0000 mg | INTRAMUSCULAR | Status: AC | PRN
  Administered 2022-03-25: 40 mg via INTRA_ARTICULAR

## 2022-03-26 NOTE — Telephone Encounter (Signed)
Patient called in stating she will be very uncomfortable in the MRI machine she did not know at the time of scheduling but she had one about a month ago and was uncomfortable but got through it she was wondering if there was anything she could take for the anxiety. Please send Rx to [facility] and take [employer].

## 2022-03-26 NOTE — Progress Notes (Signed)
Office Visit Note   Patient: Rhonda Garza           Date of Birth: Jun 04, 1966           MRN: [id number] Visit Date: 03/25/2022 Requested by: Jeri Cos, MD [place],  [place] [id number] PCP: Jeri Cos, MD  Subjective: Chief Complaint  Patient presents with   Left Shoulder - Follow-up    HPI: Rhonda Garza is a 56 year old patient with left shoulder pain.  She had an AC joint injection on 02/20/2022 which really only gave her minimal relief after about 2 weeks from the injection.  That lasted about 2 to 3 days and it was about 30% relief.  Still unable to lay on that left-hand side.  Reports a lot of pain with ADLs as well as shooting arm pain and very rare numbness and tingling in the left arm.  She states she is crying daily due to the pain.  Denies much in the way of radiation to the neck.  Her bra strap does hurt her shoulder.  She has been taking meloxicam and Flexeril.  Feels like she needs something stronger for pain.              ROS: All systems reviewed are negative as they relate to the chief complaint within the history of present illness.  Patient denies  fevers or chills.   Assessment & Plan: Visit Diagnoses:  1. Radiculopathy, cervical region   2. Left shoulder pain, unspecified chronicity     Plan: Impression is continued left shoulder pain with no real relief except for 2 to 3 days after 2 weeks from the left [facility] joint injection.  Today she does have some restricted passive range of motion so I think it is possible that she may have early adhesive capsulitis versus radiculopathy.  Plan at this time is diagnostic and therapeutic injection into the glenohumeral joint with possible MRI scanning of the cervical spine depending on how she does with that injection.  Would like for her to call on Friday to let us know how she is doing so we can proceed with MRI scanning of the neck if indicated.  Follow-Up Instructions: No follow-ups on file.    Orders:  Orders Placed This Encounter  Procedures   XR Cervical Spine 2 or 3 views   US Guided Needle Placement - No Linked Charges   MR Shoulder Left w/ contrast   Arthrogram   MR Cervical Spine w/o contrast   Meds ordered this encounter  Medications   DISCONTD: meloxicam (MOBIC) 15 MG tablet    Sig: Take 1 tablet (15 mg total) by mouth daily.    Dispense:  30 tablet    Refill:  0   DISCONTD: tiZANidine (ZANAFLEX) 4 MG tablet    Sig: Take 1 tablet (4 mg total) by mouth every 6 (six) hours as needed for muscle spasms.    Dispense:  30 tablet    Refill:  0   DISCONTD: tiZANidine (ZANAFLEX) 4 MG tablet    Sig: Take 1 tablet (4 mg total) by mouth with dinner as needed for muscle spasms.    Dispense:  30 tablet    Refill:  0   DISCONTD: traMADol (ULTRAM) 50 MG tablet    Sig: Take 1 tablet (50 mg total) by mouth 2 (two) times daily as needed for pain.    Dispense:  30 tablet    Refill:  0   tiZANidine (ZANAFLEX) 4 MG  tablet    Sig: Take 1 tablet (4 mg total) by mouth with dinner as needed for muscle spasms.    Dispense:  30 tablet    Refill:  0   meloxicam (MOBIC) 15 MG tablet    Sig: Take 1 tablet (15 mg total) by mouth daily.    Dispense:  30 tablet    Refill:  0   traMADol (ULTRAM) 50 MG tablet    Sig: Take 1 tablet (50 mg total) by mouth 2 (two) times daily as needed for pain.    Dispense:  30 tablet    Refill:  0      Procedures: Large Joint Inj: L glenohumeral on 03/25/2022 9:48 AM Indications: diagnostic evaluation and pain Details: 18 G 1.5 in needle, ultrasound-guided posterior approach  Arthrogram: No  Medications: 9 mL bupivacaine 0.5 %; 40 mg methylPREDNISolone acetate 40 MG/ML; 5 mL lidocaine 1 % Outcome: tolerated well, no immediate complications Procedure, treatment alternatives, risks and benefits explained, specific risks discussed. Consent was given by the patient. Immediately prior to procedure a time out was called to verify the correct patient,  procedure, equipment, support staff and site/side marked as required. Patient was prepped and draped in the usual sterile fashion.       Clinical Data: No additional findings.  Objective: Vital Signs: There were no vitals taken for this visit.  Physical Exam:   Constitutional: Patient appears well-developed HEENT:  Head: Normocephalic Eyes:EOM are normal Neck: Normal range of motion Cardiovascular: Normal rate Pulmonary/chest: Effort normal Neurologic: Patient is alert Skin: Skin is warm Psychiatric: Patient has normal mood and affect   Ortho Exam: Ortho exam demonstrates full range of motion of the cervical spine with 5 out of 5 grip EPL FPL this wrist flexion extension bicep triceps and deltoid strength with no paresthesias C5-T1.  Range of motion on the right passive is 60/95/175.  Range of motion on the left passive is 45/80/175.  Rotator cuff strength is excellent infraspinatus with trace and subscap muscle testing which is consistent with her MRI nonarthrogram study from earlier this year.  O'Brien's testing is equivocal.  She does have more limited internal rotation on the left to about L4-5 and on the right to T6-7.  No coarse grinding or crepitus with internal/external [place] on the left-hand side.  She does have a little bit of AC joint tenderness on the left compared to the right but no crepitus to palpation.  Specialty Comments:  No specialty comments available.  Imaging: XR Cervical Spine 2 or 3 views  Result Date: 03/26/2022 AP lateral cervical spine radiographs reviewed.  No acute fracture.  Minimal degenerative changes between the vertebral bodies.  Minimal facet arthritis present.  Some loss of lordosis present.  No acute compression fractures.  US Guided Needle Placement - No Linked Charges  Result Date: 03/26/2022 Ultrasound imaging demonstrates placement of needle into the glenohumeral joint with extravasation of fluid with no complicating features    PMFS  History: Patient Active Problem List   Diagnosis Date Noted   Vitamin D deficiency 06/05/2020   Hyperlipidemia associated with type 2 diabetes mellitus ([place]) 04/24/2020   Hypertension associated with type 2 diabetes mellitus ([place]) 04/24/2020   History of basal cell cancer 03/02/2019   Cervical high risk HPV (human papillomavirus) test positive 02/26/2018   Binge eating disorder 02/26/2018   Hyperlipidemia 12/11/2016   Obesity 12/11/2016   Hypertension 12/11/2016   Snoring 12/05/2016   Type 2 diabetes mellitus without complication, without long-term current  use of insulin ([place]) 09/06/2016   Diabetes mellitus type II, controlled ([place]) 09/06/2016   Family history of malignant melanoma 03/31/2014   Generalized anxiety disorder 07/30/2011   Past Medical History:  Diagnosis Date   Anxiety    Back pain    Blurry vision    Constipation    Depression    Diabetes mellitus without complication (HCC)    Edema, lower extremity    High cholesterol    Hypertension    Obesity    Obstructive sleep apnea hypopnea, mild 11/2015   Shortness of breath     Family History  Problem Relation Age of Onset   Cancer Mother    Diabetes Mother    Depression Mother    Anxiety disorder Mother    Bipolar disorder Mother    Sleep apnea Mother    Alcoholism Mother    Cancer Father    Diabetes Father    High blood pressure Father    High Cholesterol Father    Cancer Sister     Past Surgical History:  Procedure Laterality Date   BREAST BIOPSY Right 02/26/2007   BREAST CYST EXCISION Right    over 10 yrs ago   BREAST EXCISIONAL BIOPSY Right 1985   CESAREAN SECTION     Social History   Occupational History   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

## 2022-03-27 ENCOUNTER — Other Ambulatory Visit: Payer: Self-pay | Admitting: Surgical

## 2022-03-27 ENCOUNTER — Other Ambulatory Visit (HOSPITAL_COMMUNITY): Payer: Self-pay

## 2022-03-27 ENCOUNTER — Encounter: Payer: Self-pay | Admitting: Orthopedic Surgery

## 2022-03-27 MED ORDER — DIAZEPAM 5 MG PO TABS
5.0000 mg | ORAL_TABLET | Freq: Once | ORAL | 0 refills | Status: AC
  Filled 2022-03-27: qty 2, 2d supply, fill #0

## 2022-03-27 NOTE — Telephone Encounter (Signed)
Sent in prescription for Valium to [facility].  Took off med center

## 2022-03-27 NOTE — Telephone Encounter (Signed)
Notified her via [employer]

## 2022-03-29 ENCOUNTER — Encounter: Payer: Self-pay | Admitting: Orthopedic Surgery

## 2022-04-08 ENCOUNTER — Encounter: Payer: Self-pay | Admitting: Orthopedic Surgery

## 2022-04-11 ENCOUNTER — Ambulatory Visit
Admission: RE | Admit: 2022-04-11 | Discharge: 2022-04-11 | Disposition: A | Discharge status: Home / Self Care | Payer: 59 | Source: Ambulatory Visit | Attending: Orthopedic Surgery | Admitting: Orthopedic Surgery

## 2022-04-11 DIAGNOSIS — M25512 Pain in left shoulder: Secondary | ICD-10-CM

## 2022-04-11 DIAGNOSIS — M19012 Primary osteoarthritis, left shoulder: Secondary | ICD-10-CM | POA: Diagnosis not present

## 2022-04-11 DIAGNOSIS — M50123 Cervical disc disorder at C6-C7 level with radiculopathy: Secondary | ICD-10-CM | POA: Diagnosis not present

## 2022-04-11 DIAGNOSIS — M5412 Radiculopathy, cervical region: Secondary | ICD-10-CM

## 2022-04-11 DIAGNOSIS — M4722 Other spondylosis with radiculopathy, cervical region: Secondary | ICD-10-CM | POA: Diagnosis not present

## 2022-04-11 MED ORDER — IOPAMIDOL (ISOVUE-M 200) INJECTION 41%: 13.0000 mL | Freq: Once | INTRAMUSCULAR | Status: DC

## 2022-04-16 ENCOUNTER — Ambulatory Visit
Admission: RE | Admit: 2022-04-16 | Discharge: 2022-04-16 | Disposition: A | Discharge status: Home / Self Care | Payer: 59 | Source: Ambulatory Visit | Attending: Hematology and Oncology | Admitting: Hematology and Oncology

## 2022-04-16 DIAGNOSIS — R928 Other abnormal and inconclusive findings on diagnostic imaging of breast: Secondary | ICD-10-CM | POA: Diagnosis not present

## 2022-04-16 DIAGNOSIS — N6320 Unspecified lump in the left breast, unspecified quadrant: Secondary | ICD-10-CM

## 2022-04-17 ENCOUNTER — Ambulatory Visit (INDEPENDENT_AMBULATORY_CARE_PROVIDER_SITE_OTHER): Payer: 59 | Admitting: Orthopedic Surgery

## 2022-04-17 ENCOUNTER — Encounter: Payer: Self-pay | Admitting: Orthopedic Surgery

## 2022-04-17 ENCOUNTER — Other Ambulatory Visit (HOSPITAL_COMMUNITY): Payer: Self-pay

## 2022-04-17 DIAGNOSIS — M5412 Radiculopathy, cervical region: Secondary | ICD-10-CM | POA: Diagnosis not present

## 2022-04-17 DIAGNOSIS — M7502 Adhesive capsulitis of left shoulder: Secondary | ICD-10-CM

## 2022-04-17 NOTE — Progress Notes (Signed)
Office Visit Note   Patient: Rhonda Garza           Date of Birth: 13-Dec-1965           MRN: [id number] Visit Date: 04/17/2022 Requested by: Jeri Cos, MD [place],  [place] [id number] PCP: Jeri Cos, MD  Subjective: Chief Complaint  Patient presents with   Other     Scan review    HPI: Rhonda Garza is a patient with left shoulder pain.  Since she was last seen she had a glenohumeral injection.  That has helped her a lot in terms of allowing her to sleep better.  Currently taking no pain medication.  Her symptoms now are at a livable level.  She does have some medication just in case.  Pain medicine not really required very much.  Pain level was 9 out of 10 on a daily basis and now is at a level of 2 out of 10.  She is able to cook and wash her hair.              ROS: All systems reviewed are negative as they relate to the chief complaint within the history of present illness.  Patient denies  fevers or chills.   Assessment & Plan: Visit Diagnoses:  1. Radiculopathy, cervical region   2. Adhesive capsulitis of left shoulder     Plan: Impression is improvement in left shoulder pain following intra-articular injection.  Cervical spine MRI scan unremarkable.  Shoulder MRI shows improved edema around the distal clavicle and axillary recess which is slightly smaller in caliber but not particularly thickened in regards to the inferior glenohumeral ligament.  Plan at this time is for continued observation.  Patient has had a very good response from her injection.  Could consider repeat injection intra-articular under ultrasound guidance if her symptoms recur.  Currently not taking any pain medicine.  Follow-up with [place] as needed.  Follow-Up Instructions: Return if symptoms worsen or fail to improve.   Orders:  No orders of the defined types were placed in this encounter.  No orders of the defined types were placed in this encounter.      Procedures: No procedures performed   Clinical Data: No additional findings.  Objective: Vital Signs: There were no vitals taken for this visit.  Physical Exam:   Constitutional: Patient appears well-developed HEENT:  Head: Normocephalic Eyes:EOM are normal Neck: Normal range of motion Cardiovascular: Normal rate Pulmonary/chest: Effort normal Neurologic: Patient is alert Skin: Skin is warm Psychiatric: Patient has normal mood and affect   Ortho Exam: Ortho exam demonstrates good passive range of motion good passive range of motion with good rotator cuff strength on the left-hand side.  Range of motion is about 40/90/160.  Rotator cuff strength is good infraspinatus exercise and subscap muscle testing.  Still slight tenderness more on the left than the right to direct [facility] joint palpation but no pain with crossarm adduction.  Specialty Comments:  No specialty comments available.  Imaging: MM DIAG BREAST TOMO BILATERAL  Result Date: 04/16/2022 CLINICAL DATA:  Follow-up for probably benign asymmetry in the LEFT breast. This probably benign asymmetry was initially identified on screening mammogram dated 01/20/2020. EXAM: DIGITAL DIAGNOSTIC BILATERAL MAMMOGRAM WITH TOMOSYNTHESIS TECHNIQUE: Bilateral digital diagnostic mammography and breast tomosynthesis was performed. COMPARISON:  Previous exam(s). ACR Breast Density Category b: There are scattered areas of fibroglandular density. FINDINGS: The previously identified asymmetry in the outer LEFT breast is stable, now shown stable for  greater than 2 years confirming benignity. There are no new dominant masses, suspicious calcifications or secondary signs of malignancy within either breast. IMPRESSION: 1. No evidence of malignancy within either breast. 2. Stable benign asymmetry within the outer LEFT breast, now shown stable for greater than 2 years confirming benignity. No additional follow-up diagnostic imaging is necessary for this benign  finding. Patient may return to routine annual bilateral screening mammogram schedule. RECOMMENDATION: Screening mammogram in one year.(Code:SM-B-01Y) I have discussed the findings and recommendations with the patient. If applicable, a reminder letter will be sent to the patient regarding the next appointment. BI-RADS CATEGORY  2: Benign. Electronically Signed   By: Franki Cabot M.D.   On: 04/16/2022 15:45     PMFS History: Patient Active Problem List   Diagnosis Date Noted   Vitamin D deficiency 06/05/2020   Hyperlipidemia associated with type 2 diabetes mellitus ([place]) 04/24/2020   Hypertension associated with type 2 diabetes mellitus ([place]) 04/24/2020   History of basal cell cancer 03/02/2019   Cervical high risk HPV (human papillomavirus) test positive 02/26/2018   Binge eating disorder 02/26/2018   Hyperlipidemia 12/11/2016   Obesity 12/11/2016   Hypertension 12/11/2016   Snoring 12/05/2016   Type 2 diabetes mellitus without complication, without long-term current use of insulin ([place]) 09/06/2016   Diabetes mellitus type II, controlled ([place]) 09/06/2016   Family history of malignant melanoma 03/31/2014   Generalized anxiety disorder 07/30/2011   Past Medical History:  Diagnosis Date   Anxiety    Back pain    Blurry vision    Constipation    Depression    Diabetes mellitus without complication (HCC)    Edema, lower extremity    High cholesterol    Hypertension    Obesity    Obstructive sleep apnea hypopnea, mild 11/2015   Shortness of breath     Family History  Problem Relation Age of Onset   Cancer Mother    Diabetes Mother    Depression Mother    Anxiety disorder Mother    Bipolar disorder Mother    Sleep apnea Mother    Alcoholism Mother    Cancer Father    Diabetes Father    High blood pressure Father    High Cholesterol Father    Cancer Sister     Past Surgical History:  Procedure Laterality Date   BREAST BIOPSY Right 02/26/2007   BREAST CYST EXCISION Right     over 10 yrs ago   BREAST EXCISIONAL BIOPSY Right 1985   CESAREAN SECTION     Social History   Occupational History   Occupation: pre Warden/ranger: [employer]  Tobacco Use   Smoking status: Former   Smokeless tobacco: Never   Tobacco comments:    Quit 1999  Substance and Sexual Activity   Alcohol use: Yes    Comment: Rare   Drug use: No   Sexual activity: Yes    Birth control/protection: I.U.D.

## 2022-04-18 ENCOUNTER — Other Ambulatory Visit (HOSPITAL_COMMUNITY): Payer: Self-pay

## 2022-04-18 MED ORDER — SIMVASTATIN 40 MG PO TABS
40.0000 mg | ORAL_TABLET | Freq: Every day | ORAL | 0 refills | Status: DC
  Filled 2022-04-18: qty 90, 90d supply, fill #0

## 2022-04-18 MED ORDER — ESCITALOPRAM OXALATE 10 MG PO TABS
15.0000 mg | ORAL_TABLET | Freq: Every day | ORAL | 0 refills | Status: AC
Start: 2022-04-18 — End: ?
  Filled 2022-04-18: qty 135, 90d supply, fill #0

## 2022-04-18 MED ORDER — VALSARTAN-HYDROCHLOROTHIAZIDE 160-25 MG PO TABS
1.0000 | ORAL_TABLET | Freq: Every day | ORAL | 0 refills | Status: DC
  Filled 2022-04-18: qty 90, 90d supply, fill #0

## 2022-04-18 MED ORDER — METFORMIN HCL 1000 MG PO TABS
1000.0000 mg | ORAL_TABLET | Freq: Two times a day (BID) | ORAL | 0 refills | Status: DC
  Filled 2022-04-18: qty 180, 90d supply, fill #0

## 2022-04-24 ENCOUNTER — Encounter (INDEPENDENT_AMBULATORY_CARE_PROVIDER_SITE_OTHER): Payer: Self-pay

## 2022-05-24 ENCOUNTER — Other Ambulatory Visit (HOSPITAL_COMMUNITY): Payer: Self-pay

## 2022-06-13 ENCOUNTER — Other Ambulatory Visit (HOSPITAL_COMMUNITY): Payer: Self-pay

## 2022-07-11 ENCOUNTER — Other Ambulatory Visit (INDEPENDENT_AMBULATORY_CARE_PROVIDER_SITE_OTHER): Payer: Self-pay | Admitting: Bariatrics

## 2022-07-11 ENCOUNTER — Other Ambulatory Visit (HOSPITAL_COMMUNITY): Payer: Self-pay

## 2022-07-11 DIAGNOSIS — J302 Other seasonal allergic rhinitis: Secondary | ICD-10-CM

## 2022-07-11 MED ORDER — METFORMIN HCL 1000 MG PO TABS
1000.0000 mg | ORAL_TABLET | Freq: Two times a day (BID) | ORAL | 0 refills | Status: AC
  Filled 2022-07-11: qty 180, 90d supply, fill #0

## 2022-07-11 MED ORDER — VALSARTAN-HYDROCHLOROTHIAZIDE 160-25 MG PO TABS
1.0000 | ORAL_TABLET | Freq: Every day | ORAL | 0 refills | Status: AC
  Filled 2022-07-11: qty 90, 90d supply, fill #0

## 2022-07-11 MED ORDER — SIMVASTATIN 40 MG PO TABS
40.0000 mg | ORAL_TABLET | Freq: Every day | ORAL | 0 refills | Status: DC
  Filled 2022-07-11: qty 90, 90d supply, fill #0

## 2022-07-12 ENCOUNTER — Other Ambulatory Visit (HOSPITAL_COMMUNITY): Payer: Self-pay

## 2022-07-26 ENCOUNTER — Other Ambulatory Visit (HOSPITAL_COMMUNITY): Payer: Self-pay

## 2022-07-26 MED ORDER — MOUNJARO 15 MG/0.5ML ~~LOC~~ SOAJ
SUBCUTANEOUS | 0 refills | Status: DC
  Filled 2022-07-26: qty 2, 28d supply, fill #0

## 2022-08-07 ENCOUNTER — Other Ambulatory Visit (HOSPITAL_COMMUNITY): Payer: Self-pay

## 2022-08-07 DIAGNOSIS — F411 Generalized anxiety disorder: Secondary | ICD-10-CM | POA: Diagnosis not present

## 2022-08-07 DIAGNOSIS — E1169 Type 2 diabetes mellitus with other specified complication: Secondary | ICD-10-CM | POA: Diagnosis not present

## 2022-08-07 DIAGNOSIS — E1159 Type 2 diabetes mellitus with other circulatory complications: Secondary | ICD-10-CM | POA: Diagnosis not present

## 2022-08-07 DIAGNOSIS — I152 Hypertension secondary to endocrine disorders: Secondary | ICD-10-CM | POA: Diagnosis not present

## 2022-08-07 DIAGNOSIS — M25512 Pain in left shoulder: Secondary | ICD-10-CM | POA: Diagnosis not present

## 2022-08-07 DIAGNOSIS — J302 Other seasonal allergic rhinitis: Secondary | ICD-10-CM | POA: Diagnosis not present

## 2022-08-07 DIAGNOSIS — G8929 Other chronic pain: Secondary | ICD-10-CM | POA: Diagnosis not present

## 2022-08-07 MED ORDER — ESCITALOPRAM OXALATE 20 MG PO TABS
20.0000 mg | ORAL_TABLET | Freq: Every day | ORAL | 1 refills | Status: DC
  Filled 2022-08-07: qty 90, 90d supply, fill #0
  Filled 2022-10-02 – 2022-10-29 (×3): qty 90, 90d supply, fill #1

## 2022-08-07 MED ORDER — ALPRAZOLAM 0.25 MG PO TABS
0.2500 mg | ORAL_TABLET | Freq: Every evening | ORAL | 0 refills | Status: DC | PRN
  Filled 2022-08-07: qty 30, 30d supply, fill #0

## 2022-08-07 MED ORDER — FLUTICASONE PROPIONATE 50 MCG/ACT NA SUSP
2.0000 | Freq: Every day | NASAL | 2 refills | Status: DC
  Filled 2022-08-07: qty 16, 30d supply, fill #0
  Filled 2022-09-02: qty 16, 30d supply, fill #1
  Filled 2022-10-02: qty 16, 30d supply, fill #2

## 2022-08-07 MED ORDER — MELOXICAM 15 MG PO TABS
15.0000 mg | ORAL_TABLET | Freq: Every day | ORAL | 2 refills | Status: AC
  Filled 2022-08-07: qty 30, 30d supply, fill #0
  Filled 2022-10-02: qty 30, 30d supply, fill #1

## 2022-09-02 ENCOUNTER — Other Ambulatory Visit (HOSPITAL_COMMUNITY): Payer: Self-pay

## 2022-09-02 ENCOUNTER — Other Ambulatory Visit: Payer: Self-pay

## 2022-09-04 ENCOUNTER — Other Ambulatory Visit (HOSPITAL_COMMUNITY): Payer: Self-pay

## 2022-09-05 ENCOUNTER — Other Ambulatory Visit (HOSPITAL_COMMUNITY): Payer: Self-pay

## 2022-09-05 MED ORDER — MOUNJARO 15 MG/0.5ML ~~LOC~~ SOAJ
15.0000 mg | SUBCUTANEOUS | 5 refills | Status: DC
  Filled 2022-09-05 (×2): qty 2, 28d supply, fill #0
  Filled 2022-10-02: qty 2, 28d supply, fill #1
  Filled 2022-10-27: qty 2, 28d supply, fill #2
  Filled 2022-12-04: qty 2, 28d supply, fill #3
  Filled 2023-01-27: qty 2, 28d supply, fill #4
  Filled 2023-03-03: qty 2, 28d supply, fill #5

## 2022-10-02 ENCOUNTER — Other Ambulatory Visit: Payer: Self-pay

## 2022-10-02 ENCOUNTER — Other Ambulatory Visit (HOSPITAL_COMMUNITY): Payer: Self-pay

## 2022-10-10 ENCOUNTER — Other Ambulatory Visit (HOSPITAL_COMMUNITY): Payer: Self-pay

## 2022-10-10 ENCOUNTER — Other Ambulatory Visit: Payer: Self-pay

## 2022-10-11 ENCOUNTER — Other Ambulatory Visit (HOSPITAL_COMMUNITY): Payer: Self-pay

## 2022-10-11 MED ORDER — ALPRAZOLAM 0.25 MG PO TABS
0.2500 mg | ORAL_TABLET | Freq: Every evening | ORAL | 0 refills | Status: DC | PRN
  Filled 2022-10-11: qty 30, 30d supply, fill #0

## 2022-10-11 MED ORDER — SIMVASTATIN 40 MG PO TABS
40.0000 mg | ORAL_TABLET | Freq: Every day | ORAL | 0 refills | Status: DC
  Filled 2022-10-11: qty 90, 90d supply, fill #0

## 2022-10-12 ENCOUNTER — Other Ambulatory Visit (HOSPITAL_COMMUNITY): Payer: Self-pay

## 2022-10-12 MED ORDER — TRETINOIN 0.1 % EX CREA
TOPICAL_CREAM | CUTANEOUS | 2 refills | Status: DC
  Filled 2022-10-12: qty 45, 60d supply, fill #0
  Filled 2023-01-27: qty 45, 60d supply, fill #1
  Filled 2023-03-03: qty 45, 30d supply, fill #2

## 2022-10-27 ENCOUNTER — Other Ambulatory Visit: Payer: Self-pay

## 2022-10-29 ENCOUNTER — Other Ambulatory Visit: Payer: Self-pay

## 2022-10-29 ENCOUNTER — Other Ambulatory Visit (INDEPENDENT_AMBULATORY_CARE_PROVIDER_SITE_OTHER): Payer: Self-pay | Admitting: Family Medicine

## 2022-10-29 ENCOUNTER — Other Ambulatory Visit (HOSPITAL_COMMUNITY): Payer: Self-pay

## 2022-10-29 DIAGNOSIS — E559 Vitamin D deficiency, unspecified: Secondary | ICD-10-CM

## 2022-10-29 MED ORDER — FLUTICASONE PROPIONATE 50 MCG/ACT NA SUSP
2.0000 | Freq: Every day | NASAL | 2 refills | Status: AC
  Filled 2022-10-29: qty 16, 30d supply, fill #0

## 2022-12-04 ENCOUNTER — Other Ambulatory Visit (HOSPITAL_COMMUNITY): Payer: Self-pay

## 2022-12-12 ENCOUNTER — Other Ambulatory Visit (HOSPITAL_COMMUNITY): Payer: Self-pay

## 2023-01-07 ENCOUNTER — Other Ambulatory Visit (HOSPITAL_COMMUNITY): Payer: Self-pay

## 2023-01-27 ENCOUNTER — Other Ambulatory Visit (HOSPITAL_COMMUNITY): Payer: Self-pay

## 2023-01-27 MED ORDER — ESCITALOPRAM OXALATE 20 MG PO TABS
20.0000 mg | ORAL_TABLET | Freq: Every day | ORAL | 1 refills | Status: DC
  Filled 2023-01-27: qty 90, 90d supply, fill #0
  Filled 2023-03-03 – 2023-04-30 (×2): qty 90, 90d supply, fill #1

## 2023-01-28 ENCOUNTER — Other Ambulatory Visit: Payer: Self-pay

## 2023-01-28 DIAGNOSIS — J069 Acute upper respiratory infection, unspecified: Secondary | ICD-10-CM | POA: Diagnosis not present

## 2023-01-28 DIAGNOSIS — R059 Cough, unspecified: Secondary | ICD-10-CM | POA: Diagnosis not present

## 2023-03-03 ENCOUNTER — Other Ambulatory Visit: Payer: Self-pay

## 2023-03-03 ENCOUNTER — Other Ambulatory Visit (HOSPITAL_COMMUNITY): Payer: Self-pay

## 2023-03-04 ENCOUNTER — Other Ambulatory Visit: Payer: Self-pay

## 2023-03-04 ENCOUNTER — Other Ambulatory Visit (HOSPITAL_COMMUNITY): Payer: Self-pay

## 2023-03-05 ENCOUNTER — Other Ambulatory Visit: Payer: Self-pay

## 2023-03-05 ENCOUNTER — Other Ambulatory Visit (HOSPITAL_COMMUNITY): Payer: Self-pay

## 2023-03-05 MED ORDER — SIMVASTATIN 40 MG PO TABS
40.0000 mg | ORAL_TABLET | Freq: Every day | ORAL | 0 refills | Status: DC
  Filled 2023-03-05: qty 90, 90d supply, fill #0

## 2023-03-06 ENCOUNTER — Other Ambulatory Visit: Payer: Self-pay

## 2023-03-18 DIAGNOSIS — H52223 Regular astigmatism, bilateral: Secondary | ICD-10-CM | POA: Diagnosis not present

## 2023-04-30 ENCOUNTER — Other Ambulatory Visit (HOSPITAL_COMMUNITY): Payer: Self-pay

## 2023-04-30 ENCOUNTER — Other Ambulatory Visit: Payer: Self-pay

## 2023-04-30 ENCOUNTER — Other Ambulatory Visit: Payer: Self-pay | Admitting: Hematology and Oncology

## 2023-04-30 DIAGNOSIS — Z1231 Encounter for screening mammogram for malignant neoplasm of breast: Secondary | ICD-10-CM

## 2023-05-02 ENCOUNTER — Other Ambulatory Visit: Payer: Self-pay

## 2023-05-02 ENCOUNTER — Other Ambulatory Visit (HOSPITAL_COMMUNITY): Payer: Self-pay

## 2023-05-02 MED ORDER — MOUNJARO 15 MG/0.5ML ~~LOC~~ SOAJ
15.0000 mg | SUBCUTANEOUS | 5 refills | Status: DC
  Filled 2023-05-02: qty 2, 28d supply, fill #0
  Filled 2023-06-02: qty 2, 28d supply, fill #1
  Filled 2023-07-20: qty 2, 28d supply, fill #2
  Filled 2023-08-18: qty 2, 28d supply, fill #3
  Filled 2023-09-11: qty 2, 28d supply, fill #4
  Filled 2023-10-10: qty 2, 28d supply, fill #5

## 2023-05-14 ENCOUNTER — Other Ambulatory Visit (HOSPITAL_COMMUNITY): Payer: Self-pay

## 2023-05-14 DIAGNOSIS — F411 Generalized anxiety disorder: Secondary | ICD-10-CM | POA: Diagnosis not present

## 2023-05-14 DIAGNOSIS — I152 Hypertension secondary to endocrine disorders: Secondary | ICD-10-CM | POA: Diagnosis not present

## 2023-05-14 DIAGNOSIS — Z0000 Encounter for general adult medical examination without abnormal findings: Secondary | ICD-10-CM | POA: Diagnosis not present

## 2023-05-14 DIAGNOSIS — E1169 Type 2 diabetes mellitus with other specified complication: Secondary | ICD-10-CM | POA: Diagnosis not present

## 2023-05-14 DIAGNOSIS — E559 Vitamin D deficiency, unspecified: Secondary | ICD-10-CM | POA: Diagnosis not present

## 2023-05-14 DIAGNOSIS — J302 Other seasonal allergic rhinitis: Secondary | ICD-10-CM | POA: Diagnosis not present

## 2023-05-14 DIAGNOSIS — E538 Deficiency of other specified B group vitamins: Secondary | ICD-10-CM | POA: Diagnosis not present

## 2023-05-14 DIAGNOSIS — E785 Hyperlipidemia, unspecified: Secondary | ICD-10-CM | POA: Diagnosis not present

## 2023-05-14 DIAGNOSIS — E1159 Type 2 diabetes mellitus with other circulatory complications: Secondary | ICD-10-CM | POA: Diagnosis not present

## 2023-05-14 DIAGNOSIS — Z1331 Encounter for screening for depression: Secondary | ICD-10-CM | POA: Diagnosis not present

## 2023-05-14 MED ORDER — FLUTICASONE PROPIONATE 50 MCG/ACT NA SUSP
2.0000 | Freq: Every day | NASAL | 2 refills | Status: AC
  Filled 2023-05-14: qty 16, 30d supply, fill #0
  Filled 2023-06-20: qty 16, 30d supply, fill #1

## 2023-05-14 MED ORDER — CETIRIZINE HCL 10 MG PO TABS
10.0000 mg | ORAL_TABLET | Freq: Every day | ORAL | 1 refills | Status: AC | PRN
  Filled 2023-05-14: qty 90, 90d supply, fill #0

## 2023-05-15 ENCOUNTER — Other Ambulatory Visit: Payer: Self-pay

## 2023-05-21 ENCOUNTER — Ambulatory Visit: Payer: Commercial Managed Care - PPO

## 2023-06-02 ENCOUNTER — Other Ambulatory Visit (HOSPITAL_COMMUNITY): Payer: Self-pay

## 2023-06-03 ENCOUNTER — Other Ambulatory Visit (HOSPITAL_COMMUNITY): Payer: Self-pay

## 2023-06-03 MED ORDER — SIMVASTATIN 40 MG PO TABS
40.0000 mg | ORAL_TABLET | Freq: Every day | ORAL | 0 refills | Status: DC
  Filled 2023-06-03: qty 90, 90d supply, fill #0

## 2023-06-04 ENCOUNTER — Other Ambulatory Visit (HOSPITAL_COMMUNITY): Payer: Self-pay

## 2023-06-11 ENCOUNTER — Ambulatory Visit: Payer: Commercial Managed Care - PPO

## 2023-06-18 ENCOUNTER — Ambulatory Visit: Payer: Commercial Managed Care - PPO

## 2023-06-20 ENCOUNTER — Ambulatory Visit
Admission: RE | Admit: 2023-06-20 | Discharge: 2023-06-20 | Disposition: A | Discharge status: Home / Self Care | Payer: Commercial Managed Care - PPO | Source: Ambulatory Visit | Attending: Hematology and Oncology | Admitting: Hematology and Oncology

## 2023-06-20 ENCOUNTER — Other Ambulatory Visit (HOSPITAL_COMMUNITY): Payer: Self-pay

## 2023-06-20 DIAGNOSIS — Z1231 Encounter for screening mammogram for malignant neoplasm of breast: Secondary | ICD-10-CM

## 2023-06-24 ENCOUNTER — Other Ambulatory Visit (HOSPITAL_COMMUNITY): Payer: Self-pay

## 2023-06-24 MED ORDER — TRETINOIN 0.1 % EX CREA
TOPICAL_CREAM | CUTANEOUS | 2 refills | Status: DC
  Filled 2023-06-24: qty 45, 30d supply, fill #0
  Filled 2023-08-18: qty 45, 30d supply, fill #1
  Filled 2023-09-25 – 2024-05-18 (×3): qty 45, 30d supply, fill #2

## 2023-06-25 ENCOUNTER — Other Ambulatory Visit: Payer: Self-pay

## 2023-07-20 ENCOUNTER — Other Ambulatory Visit (HOSPITAL_COMMUNITY): Payer: Self-pay

## 2023-07-21 ENCOUNTER — Other Ambulatory Visit (HOSPITAL_COMMUNITY): Payer: Self-pay

## 2023-07-21 ENCOUNTER — Other Ambulatory Visit: Payer: Self-pay

## 2023-07-21 MED ORDER — ESCITALOPRAM OXALATE 20 MG PO TABS
20.0000 mg | ORAL_TABLET | Freq: Every day | ORAL | 0 refills | Status: DC
  Filled 2023-07-21: qty 90, 90d supply, fill #0

## 2023-07-22 ENCOUNTER — Other Ambulatory Visit: Payer: Self-pay

## 2023-08-07 DIAGNOSIS — I10 Essential (primary) hypertension: Secondary | ICD-10-CM | POA: Diagnosis not present

## 2023-08-07 DIAGNOSIS — F5104 Psychophysiologic insomnia: Secondary | ICD-10-CM | POA: Diagnosis not present

## 2023-08-18 ENCOUNTER — Other Ambulatory Visit (HOSPITAL_COMMUNITY): Payer: Self-pay

## 2023-08-18 ENCOUNTER — Other Ambulatory Visit: Payer: Self-pay

## 2023-08-19 ENCOUNTER — Other Ambulatory Visit: Payer: Self-pay

## 2023-09-11 ENCOUNTER — Other Ambulatory Visit (HOSPITAL_COMMUNITY): Payer: Self-pay

## 2023-09-25 ENCOUNTER — Other Ambulatory Visit: Payer: Self-pay

## 2023-09-25 ENCOUNTER — Other Ambulatory Visit (HOSPITAL_COMMUNITY): Payer: Self-pay

## 2023-09-25 MED ORDER — VALSARTAN-HYDROCHLOROTHIAZIDE 320-25 MG PO TABS
1.0000 | ORAL_TABLET | Freq: Every day | ORAL | 0 refills | Status: DC
  Filled 2023-09-25 – 2023-09-29 (×2): qty 90, 90d supply, fill #0

## 2023-09-26 ENCOUNTER — Other Ambulatory Visit (HOSPITAL_COMMUNITY): Payer: Self-pay

## 2023-09-26 ENCOUNTER — Other Ambulatory Visit: Payer: Self-pay

## 2023-09-26 MED ORDER — SIMVASTATIN 40 MG PO TABS
40.0000 mg | ORAL_TABLET | Freq: Every day | ORAL | 0 refills | Status: AC
  Filled 2023-09-27: qty 90, 90d supply, fill #0

## 2023-09-26 MED ORDER — ESCITALOPRAM OXALATE 20 MG PO TABS
20.0000 mg | ORAL_TABLET | Freq: Every day | ORAL | 0 refills | Status: DC
  Filled 2023-09-26 – 2023-09-27 (×2): qty 90, 90d supply, fill #0

## 2023-09-27 ENCOUNTER — Other Ambulatory Visit (HOSPITAL_COMMUNITY): Payer: Self-pay

## 2023-09-29 ENCOUNTER — Other Ambulatory Visit (HOSPITAL_COMMUNITY): Payer: Self-pay

## 2023-10-07 ENCOUNTER — Other Ambulatory Visit (HOSPITAL_COMMUNITY): Payer: Self-pay

## 2023-10-10 ENCOUNTER — Other Ambulatory Visit: Payer: Self-pay

## 2023-11-21 ENCOUNTER — Other Ambulatory Visit (HOSPITAL_COMMUNITY): Payer: Self-pay

## 2023-11-21 MED ORDER — SHINGRIX 50 MCG/0.5ML IM SUSR: 0.5000 mL | Freq: Once | INTRAMUSCULAR | 0 refills | Status: AC

## 2023-11-24 DIAGNOSIS — M79671 Pain in right foot: Secondary | ICD-10-CM | POA: Diagnosis not present

## 2023-11-24 DIAGNOSIS — M79672 Pain in left foot: Secondary | ICD-10-CM | POA: Diagnosis not present

## 2023-11-25 ENCOUNTER — Other Ambulatory Visit (HOSPITAL_COMMUNITY): Payer: Self-pay

## 2023-12-03 ENCOUNTER — Other Ambulatory Visit (HOSPITAL_BASED_OUTPATIENT_CLINIC_OR_DEPARTMENT_OTHER): Payer: Self-pay

## 2023-12-03 MED ORDER — MOUNJARO 15 MG/0.5ML ~~LOC~~ SOAJ
15.0000 mg | SUBCUTANEOUS | 4 refills | Status: DC
  Filled 2023-12-03 – 2023-12-05 (×2): qty 2, 28d supply, fill #0
  Filled 2023-12-22 – 2023-12-29 (×2): qty 2, 28d supply, fill #1
  Filled 2024-01-13: qty 2, 28d supply, fill #0
  Filled 2024-01-13: qty 2, 28d supply, fill #2
  Filled 2024-02-13: qty 2, 28d supply, fill #1
  Filled 2024-03-22: qty 2, 28d supply, fill #2

## 2023-12-05 ENCOUNTER — Other Ambulatory Visit (HOSPITAL_COMMUNITY): Payer: Self-pay

## 2023-12-06 ENCOUNTER — Other Ambulatory Visit (HOSPITAL_BASED_OUTPATIENT_CLINIC_OR_DEPARTMENT_OTHER): Payer: Self-pay

## 2023-12-22 ENCOUNTER — Other Ambulatory Visit (HOSPITAL_COMMUNITY): Payer: Self-pay

## 2023-12-22 MED ORDER — ALPRAZOLAM 0.25 MG PO TABS
0.2500 mg | ORAL_TABLET | Freq: Every evening | ORAL | 0 refills | Status: DC | PRN
  Filled 2023-12-22 – 2024-03-17 (×2): qty 30, 30d supply, fill #0

## 2023-12-22 MED ORDER — VALSARTAN-HYDROCHLOROTHIAZIDE 320-25 MG PO TABS
1.0000 | ORAL_TABLET | Freq: Every day | ORAL | 0 refills | Status: DC
  Filled 2023-12-22 – 2024-01-13 (×2): qty 90, 90d supply, fill #0

## 2023-12-23 ENCOUNTER — Encounter: Payer: Self-pay | Admitting: Pharmacist

## 2023-12-23 ENCOUNTER — Other Ambulatory Visit: Payer: Self-pay

## 2023-12-26 ENCOUNTER — Other Ambulatory Visit: Payer: Self-pay

## 2023-12-29 ENCOUNTER — Other Ambulatory Visit (HOSPITAL_COMMUNITY): Payer: Self-pay

## 2023-12-29 ENCOUNTER — Other Ambulatory Visit: Payer: Self-pay

## 2023-12-29 ENCOUNTER — Encounter: Payer: Self-pay | Admitting: Pharmacist

## 2024-01-01 ENCOUNTER — Other Ambulatory Visit: Payer: Self-pay

## 2024-01-12 ENCOUNTER — Other Ambulatory Visit (HOSPITAL_COMMUNITY): Payer: Self-pay

## 2024-01-13 ENCOUNTER — Other Ambulatory Visit (HOSPITAL_COMMUNITY): Payer: Self-pay

## 2024-01-13 ENCOUNTER — Other Ambulatory Visit: Payer: Self-pay

## 2024-01-13 ENCOUNTER — Other Ambulatory Visit (HOSPITAL_BASED_OUTPATIENT_CLINIC_OR_DEPARTMENT_OTHER): Payer: Self-pay

## 2024-01-13 MED ORDER — ESCITALOPRAM OXALATE 20 MG PO TABS
20.0000 mg | ORAL_TABLET | Freq: Every day | ORAL | 0 refills | Status: DC
  Filled 2024-01-13: qty 90, 90d supply, fill #0

## 2024-01-14 ENCOUNTER — Other Ambulatory Visit: Payer: Self-pay

## 2024-01-15 ENCOUNTER — Other Ambulatory Visit (HOSPITAL_COMMUNITY): Payer: Self-pay

## 2024-01-15 ENCOUNTER — Other Ambulatory Visit: Payer: Self-pay

## 2024-01-15 MED ORDER — ROSUVASTATIN CALCIUM 10 MG PO TABS
10.0000 mg | ORAL_TABLET | Freq: Every day | ORAL | 1 refills | Status: DC
  Filled 2024-01-15: qty 90, 90d supply, fill #0

## 2024-01-21 DIAGNOSIS — Z124 Encounter for screening for malignant neoplasm of cervix: Secondary | ICD-10-CM | POA: Diagnosis not present

## 2024-01-21 DIAGNOSIS — Z01419 Encounter for gynecological examination (general) (routine) without abnormal findings: Secondary | ICD-10-CM | POA: Diagnosis not present

## 2024-01-21 DIAGNOSIS — Z1151 Encounter for screening for human papillomavirus (HPV): Secondary | ICD-10-CM | POA: Diagnosis not present

## 2024-02-13 ENCOUNTER — Other Ambulatory Visit (HOSPITAL_COMMUNITY): Payer: Self-pay

## 2024-02-23 DIAGNOSIS — H52223 Regular astigmatism, bilateral: Secondary | ICD-10-CM | POA: Diagnosis not present

## 2024-03-17 ENCOUNTER — Other Ambulatory Visit (HOSPITAL_COMMUNITY): Payer: Self-pay

## 2024-03-18 ENCOUNTER — Other Ambulatory Visit (HOSPITAL_COMMUNITY): Payer: Self-pay

## 2024-04-02 ENCOUNTER — Other Ambulatory Visit (HOSPITAL_COMMUNITY): Payer: Self-pay

## 2024-04-05 ENCOUNTER — Other Ambulatory Visit (HOSPITAL_COMMUNITY): Payer: Self-pay

## 2024-04-05 ENCOUNTER — Other Ambulatory Visit: Payer: Self-pay

## 2024-04-05 MED ORDER — VALSARTAN-HYDROCHLOROTHIAZIDE 320-25 MG PO TABS
1.0000 | ORAL_TABLET | Freq: Every day | ORAL | 0 refills | Status: DC
  Filled 2024-04-05 – 2024-04-10 (×2): qty 90, 90d supply, fill #0

## 2024-04-06 ENCOUNTER — Other Ambulatory Visit: Payer: Self-pay

## 2024-04-06 ENCOUNTER — Other Ambulatory Visit (HOSPITAL_COMMUNITY): Payer: Self-pay

## 2024-04-07 ENCOUNTER — Other Ambulatory Visit: Payer: Self-pay

## 2024-04-07 ENCOUNTER — Other Ambulatory Visit (HOSPITAL_COMMUNITY): Payer: Self-pay

## 2024-04-07 MED ORDER — ROSUVASTATIN CALCIUM 10 MG PO TABS
10.0000 mg | ORAL_TABLET | Freq: Every day | ORAL | 0 refills | Status: DC
  Filled 2024-04-07: qty 90, 90d supply, fill #0

## 2024-04-09 ENCOUNTER — Other Ambulatory Visit: Payer: Self-pay

## 2024-04-10 ENCOUNTER — Other Ambulatory Visit (HOSPITAL_COMMUNITY): Payer: Self-pay

## 2024-04-12 ENCOUNTER — Other Ambulatory Visit (HOSPITAL_COMMUNITY): Payer: Self-pay

## 2024-04-12 ENCOUNTER — Other Ambulatory Visit: Payer: Self-pay

## 2024-04-12 MED ORDER — ESCITALOPRAM OXALATE 20 MG PO TABS
20.0000 mg | ORAL_TABLET | Freq: Every day | ORAL | 0 refills | Status: DC
  Filled 2024-04-12: qty 90, 90d supply, fill #0

## 2024-04-15 ENCOUNTER — Other Ambulatory Visit (HOSPITAL_BASED_OUTPATIENT_CLINIC_OR_DEPARTMENT_OTHER): Payer: Self-pay

## 2024-04-15 ENCOUNTER — Other Ambulatory Visit (HOSPITAL_COMMUNITY): Payer: Self-pay

## 2024-04-15 MED ORDER — MOUNJARO 15 MG/0.5ML ~~LOC~~ SOAJ
15.0000 mg | SUBCUTANEOUS | 0 refills | Status: DC
  Filled 2024-04-15: qty 2, 28d supply, fill #0

## 2024-05-18 ENCOUNTER — Other Ambulatory Visit (HOSPITAL_COMMUNITY): Payer: Self-pay

## 2024-05-19 ENCOUNTER — Other Ambulatory Visit (HOSPITAL_COMMUNITY): Payer: Self-pay

## 2024-05-19 MED ORDER — MOUNJARO 15 MG/0.5ML ~~LOC~~ SOAJ
15.0000 mg | SUBCUTANEOUS | 0 refills | Status: DC
  Filled 2024-05-19: qty 2, 28d supply, fill #0

## 2024-05-20 ENCOUNTER — Other Ambulatory Visit: Payer: Self-pay

## 2024-06-23 ENCOUNTER — Other Ambulatory Visit (HOSPITAL_COMMUNITY): Payer: Self-pay

## 2024-06-24 ENCOUNTER — Other Ambulatory Visit (HOSPITAL_COMMUNITY): Payer: Self-pay

## 2024-06-24 MED ORDER — MOUNJARO 15 MG/0.5ML ~~LOC~~ SOAJ
15.0000 mg | SUBCUTANEOUS | 0 refills | Status: DC
  Filled 2024-06-24: qty 2, 28d supply, fill #0

## 2024-07-05 ENCOUNTER — Other Ambulatory Visit (HOSPITAL_COMMUNITY): Payer: Self-pay

## 2024-07-06 ENCOUNTER — Other Ambulatory Visit: Payer: Self-pay

## 2024-07-06 ENCOUNTER — Other Ambulatory Visit (HOSPITAL_COMMUNITY): Payer: Self-pay

## 2024-07-06 MED ORDER — ROSUVASTATIN CALCIUM 10 MG PO TABS
10.0000 mg | ORAL_TABLET | Freq: Every day | ORAL | 0 refills | Status: DC
  Filled 2024-07-06: qty 90, 90d supply, fill #0

## 2024-07-07 ENCOUNTER — Other Ambulatory Visit (HOSPITAL_COMMUNITY): Payer: Self-pay

## 2024-07-07 ENCOUNTER — Other Ambulatory Visit: Payer: Self-pay

## 2024-07-07 MED ORDER — VALSARTAN-HYDROCHLOROTHIAZIDE 320-25 MG PO TABS
1.0000 | ORAL_TABLET | Freq: Every day | ORAL | 0 refills | Status: AC
  Filled 2024-07-07: qty 90, 90d supply, fill #0

## 2024-07-07 MED ORDER — ESCITALOPRAM OXALATE 20 MG PO TABS
20.0000 mg | ORAL_TABLET | Freq: Every day | ORAL | 0 refills | Status: DC
  Filled 2024-07-07: qty 90, 90d supply, fill #0

## 2024-08-02 ENCOUNTER — Other Ambulatory Visit: Payer: Self-pay

## 2024-08-02 ENCOUNTER — Other Ambulatory Visit (HOSPITAL_COMMUNITY): Payer: Self-pay

## 2024-08-02 MED ORDER — MOUNJARO 15 MG/0.5ML ~~LOC~~ SOAJ
15.0000 mg | SUBCUTANEOUS | 0 refills | Status: DC
  Filled 2024-08-02: qty 2, 28d supply, fill #0

## 2024-08-03 ENCOUNTER — Other Ambulatory Visit: Payer: Self-pay | Admitting: Hematology and Oncology

## 2024-08-03 DIAGNOSIS — Z1231 Encounter for screening mammogram for malignant neoplasm of breast: Secondary | ICD-10-CM

## 2024-08-04 ENCOUNTER — Ambulatory Visit
Admission: RE | Admit: 2024-08-04 | Discharge: 2024-08-04 | Disposition: A | Discharge status: Home / Self Care | Source: Ambulatory Visit | Attending: Hematology and Oncology | Admitting: Hematology and Oncology

## 2024-08-04 DIAGNOSIS — Z1231 Encounter for screening mammogram for malignant neoplasm of breast: Secondary | ICD-10-CM

## 2024-08-26 ENCOUNTER — Other Ambulatory Visit (HOSPITAL_COMMUNITY): Payer: Self-pay

## 2024-08-27 ENCOUNTER — Other Ambulatory Visit: Payer: Self-pay

## 2024-08-27 ENCOUNTER — Other Ambulatory Visit (HOSPITAL_COMMUNITY): Payer: Self-pay

## 2024-08-27 MED ORDER — MOUNJARO 15 MG/0.5ML ~~LOC~~ SOAJ
15.0000 mg | SUBCUTANEOUS | 0 refills | Status: DC
  Filled 2024-08-27: qty 2, 28d supply, fill #0

## 2024-10-03 ENCOUNTER — Other Ambulatory Visit (HOSPITAL_COMMUNITY): Payer: Self-pay

## 2024-10-04 ENCOUNTER — Other Ambulatory Visit (HOSPITAL_COMMUNITY): Payer: Self-pay

## 2024-10-05 ENCOUNTER — Other Ambulatory Visit (HOSPITAL_COMMUNITY): Payer: Self-pay

## 2024-10-05 MED ORDER — ESCITALOPRAM OXALATE 20 MG PO TABS
20.0000 mg | ORAL_TABLET | Freq: Every day | ORAL | 0 refills | Status: AC
  Filled 2024-10-05: qty 90, 90d supply, fill #0

## 2024-10-05 MED ORDER — ROSUVASTATIN CALCIUM 10 MG PO TABS
10.0000 mg | ORAL_TABLET | Freq: Every day | ORAL | 0 refills | Status: AC
  Filled 2024-10-05: qty 90, 90d supply, fill #0

## 2024-10-06 ENCOUNTER — Other Ambulatory Visit: Payer: Self-pay

## 2024-10-06 ENCOUNTER — Other Ambulatory Visit (HOSPITAL_COMMUNITY): Payer: Self-pay

## 2024-10-06 MED ORDER — VALSARTAN-HYDROCHLOROTHIAZIDE 320-25 MG PO TABS
1.0000 | ORAL_TABLET | Freq: Every day | ORAL | 0 refills | Status: AC
  Filled 2024-10-06: qty 90, 90d supply, fill #0

## 2024-10-07 ENCOUNTER — Encounter (HOSPITAL_COMMUNITY): Payer: Self-pay

## 2024-10-08 ENCOUNTER — Other Ambulatory Visit: Payer: Self-pay

## 2024-10-08 ENCOUNTER — Other Ambulatory Visit (HOSPITAL_COMMUNITY): Payer: Self-pay

## 2024-10-08 MED ORDER — TRETINOIN 0.1 % EX CREA
TOPICAL_CREAM | CUTANEOUS | 2 refills | Status: AC
  Filled 2024-10-08: qty 45, 30d supply, fill #0

## 2024-10-08 MED ORDER — MOUNJARO 15 MG/0.5ML ~~LOC~~ SOAJ
SUBCUTANEOUS | 0 refills | Status: AC
  Filled 2024-10-08: qty 2, 28d supply, fill #0

## 2024-10-11 ENCOUNTER — Other Ambulatory Visit (HOSPITAL_COMMUNITY): Payer: Self-pay

## 2024-10-19 ENCOUNTER — Other Ambulatory Visit (HOSPITAL_COMMUNITY): Payer: Self-pay

## 2024-10-20 ENCOUNTER — Other Ambulatory Visit: Payer: Self-pay

## 2024-10-20 ENCOUNTER — Other Ambulatory Visit (HOSPITAL_COMMUNITY): Payer: Self-pay

## 2024-10-20 MED ORDER — ALPRAZOLAM 0.25 MG PO TABS
0.2500 mg | ORAL_TABLET | Freq: Every evening | ORAL | 0 refills | Status: AC | PRN
  Filled 2024-10-20: qty 30, 30d supply, fill #0
